# Patient Record
Sex: Female | Born: 1970 | Race: Black or African American | Hispanic: No | Marital: Married | Smoking: Never smoker
Health system: Southern US, Community
[De-identification: ages and names within clinical notes are randomized; demographics above are authoritative.]

## PROBLEM LIST (undated history)

## (undated) DIAGNOSIS — G709 Myoneural disorder, unspecified: Secondary | ICD-10-CM

## (undated) DIAGNOSIS — I10 Essential (primary) hypertension: Secondary | ICD-10-CM

## (undated) DIAGNOSIS — K219 Gastro-esophageal reflux disease without esophagitis: Secondary | ICD-10-CM

## (undated) DIAGNOSIS — E119 Type 2 diabetes mellitus without complications: Secondary | ICD-10-CM

## (undated) HISTORY — PX: OTHER SURGICAL HISTORY: SHX169

## (undated) HISTORY — DX: Type 2 diabetes mellitus without complications: E11.9

## (undated) HISTORY — PX: LUNG SURGERY: SHX703

---

## 2014-06-24 ENCOUNTER — Ambulatory Visit (INDEPENDENT_AMBULATORY_CARE_PROVIDER_SITE_OTHER): Payer: No Typology Code available for payment source

## 2014-06-24 VITALS — BP 139/81 | HR 99 | Resp 18

## 2014-06-24 DIAGNOSIS — S90129A Contusion of unspecified lesser toe(s) without damage to nail, initial encounter: Secondary | ICD-10-CM

## 2014-06-24 DIAGNOSIS — E1142 Type 2 diabetes mellitus with diabetic polyneuropathy: Secondary | ICD-10-CM

## 2014-06-24 DIAGNOSIS — E1140 Type 2 diabetes mellitus with diabetic neuropathy, unspecified: Secondary | ICD-10-CM

## 2014-06-24 DIAGNOSIS — L03039 Cellulitis of unspecified toe: Secondary | ICD-10-CM

## 2014-06-24 MED ORDER — CEPHALEXIN 500 MG PO CAPS: 500.0000 mg | ORAL_CAPSULE | Freq: Three times a day (TID) | ORAL | Status: AC

## 2014-06-24 NOTE — Progress Notes (Signed)
° °  Subjective:    Patient ID: Debbie Pacheco, female    DOB: [birth year], 43 y.o.   MRN: [id number]  HPI I AM A DIABETIC AND I WAS REFERRED BY DR April WILSON AND I HAVE NEUROPATHY AND A DOCTOR GAVE ME NEURONTIN AND IT DIDN'T SETTLE WITH MY STOMACH AND I WOULD LIKE TO TRY SOMETHING IF I COULD AND BURNS AND THROBS AND NUMBNESS AND TINGLING AND MY LEFT TOENAIL HAS A CRACK IN IT AND HAS HAD SOME PUSS AND THIS HAS BEEN GOING ON SINCE LAST THURSDAY  AND COLD ON MY FEET AT NIGHT     Review of Systems review of systems is unremarkable on all systems except for neuropathy burning and stinging and abnormal sensations to both feet and legs previously tried gabapentin could not tolerate GI upset     Objective:   Physical Exam 43 year old [place] female presents this time for initial visit for referral from primary physician has a history of contusion or injury to her left great toenail with the nail and crack or partially separated from the nailbed it's painful tender symptomatic patient also describes having prominence of paresthesias burning and stinging in her feet are straight objective findings as follows vascular status appears to be intact with pedal pulses palpable DP and PT +2/4 bilateral capillary refill time 3 seconds all digits epicritic and proprioceptive sensations diminished on Semmes Weinstein testing to the forefoot and digits patient Baird LyonsCasey last A1c was in the 12th range in her last blood glucose taken earlier today was 200. Although patient case she is actually improving from where she was. Patient describing burning and paresthesias in both feet legs at times on Semmes Weinstein testing absent sensation to the digits forefoot and heel area intact sensation over the dorsal foot leg and ankle there is normal plantar response DTRs not elicited dermatologically skin color pigment normal hair growth absent nails somewhat criptotic do left hallux nail shows an transverse fissuring and mid nail  with separation from the nailbed it's painful tender on palpation and ambulation. No other open wounds no other ulcerations no ascending psoas lymphangitis noted mild bloody drainage identified from the nailbed orthopedic biomechanical exam was rectus foot type mild semirigid digital contractures noted patient ambulating in flip-flops and otherwise offers and appropriate shoes also not been wearing socks.       Assessment & Plan:  Assessment this time his diabetes with contusion left great toe with fissuring or cracking of the nail and mild early paronychia. At this time the area is cleansed of Betadine prep was performed following local anesthetic block the left great toe the nail plate is partially avulsed the loose portions of nail fragment were debrided the remaining proximal half the nail is intact and the nailbed distally and slight laceration which is treated with Silvadene and gauze dressing being applied. Initiate daily cleansing with soap and water applying is for and Band-Aid dressing daily and a prescription for cephalexin was issued at this time. Patient will be candidate for neuropathy treatment however did not want to start another medication while taking antibiotic due to history of GI difficulties will recheck in 3 weeks for nail check check and evaluation at that time may initiate treatment with Lyrica for the peripheral neuropathy also dispensed literature on diabetic foot care proper shoe and sock wear. Scheduled to 3 week followup for nail check  Alvan Dameichard Sikora DPM

## 2014-06-24 NOTE — Patient Instructions (Signed)
ANTIBACTERIAL SOAP INSTRUCTIONS  THE DAY AFTER PROCEDURE  Please follow the instructions your doctor has marked.   Shower as usual. Before getting out, place a drop of antibacterial liquid soap (Dial) on a wet, clean washcloth.  Gently wipe washcloth over affected area.  Afterward, rinse the area with warm water.  Blot the area dry with a soft cloth and cover with antibiotic ointment (neosporin, polysporin, bacitracin) and band aid or gauze and tape  Place 3-4 drops of antibacterial liquid soap in a quart of warm tap water.  Submerge foot into water for 20 minutes.  If bandage was applied after your procedure, leave on to allow for easy lift off, then remove and continue with soak for the remaining time.  Next, blot area dry with a soft cloth and cover with a bandage.  Apply other medications as directed by your doctor, such as cortisporin otic solution (eardrops) or neosporin antibiotic ointment  After cleansing foot with soap and water daily rinse and dry thoroughly apply Neosporin and Band-Aid dressing daily maintain Neosporin and Band-Aid dressing his lungs is a discharge or drainage until the toe nailbed is healed    Diabetes and Foot Care Diabetes may cause you to have problems because of poor blood supply (circulation) to your feet and legs. This may cause the skin on your feet to become thinner, break easier, and heal more slowly. Your skin may become dry, and the skin may peel and crack. You may also have nerve damage in your legs and feet causing decreased feeling in them. You may not notice minor injuries to your feet that could lead to infections or more serious problems. Taking care of your feet is one of the most important things you can do for yourself.  HOME CARE INSTRUCTIONS  Wear shoes at all times, even in the house. Do not go barefoot. Bare feet are easily injured.  Check your feet daily for blisters, cuts, and redness. If you cannot see the bottom of your feet, use a mirror  or ask someone for help.  Wash your feet with warm water (do not use hot water) and mild soap. Then pat your feet and the areas between your toes until they are completely dry. Do not soak your feet as this can dry your skin.  Apply a moisturizing lotion or petroleum jelly (that does not contain alcohol and is unscented) to the skin on your feet and to dry, brittle toenails. Do not apply lotion between your toes.  Trim your toenails straight across. Do not dig under them or around the cuticle. File the edges of your nails with an emery board or nail file.  Do not cut corns or calluses or try to remove them with medicine.  Wear clean socks or stockings every day. Make sure they are not too tight. Do not wear knee-high stockings since they may decrease blood flow to your legs.  Wear shoes that fit properly and have enough cushioning. To break in new shoes, wear them for just a few hours a day. This prevents you from injuring your feet. Always look in your shoes before you put them on to be sure there are no objects inside.  Do not cross your legs. This may decrease the blood flow to your feet.  If you find a minor scrape, cut, or break in the skin on your feet, keep it and the skin around it clean and dry. These areas may be cleansed with mild soap and water. Do not  cleanse the area with peroxide, alcohol, or iodine.  When you remove an adhesive bandage, be sure not to damage the skin around it.  If you have a wound, look at it several times a day to make sure it is healing.  Do not use heating pads or hot water bottles. They may burn your skin. If you have lost feeling in your feet or legs, you may not know it is happening until it is too late.  Make sure your health care provider performs a complete foot exam at least annually or more often if you have foot problems. Report any cuts, sores, or bruises to your health care provider immediately. SEEK MEDICAL CARE IF:   You have an injury that  is not healing.  You have cuts or breaks in the skin.  You have an ingrown nail.  You notice redness on your legs or feet.  You feel burning or tingling in your legs or feet.  You have pain or cramps in your legs and feet.  Your legs or feet are numb.  Your feet always feel cold. SEEK IMMEDIATE MEDICAL CARE IF:   There is increasing redness, swelling, or pain in or around a wound.  There is a red line that goes up your leg.  Pus is coming from a wound.  You develop a fever or as directed by your health care provider.  You notice a bad smell coming from an ulcer or wound. Document Released: 11/23/2000 Document Revised: 07/29/2013 Document Reviewed: 05/05/2013 [facility] Patient Information 2015 [employer], [place]. This information is not intended to replace advice given to you by your health care provider. Make sure you discuss any questions you have with your health care provider.

## 2014-07-15 ENCOUNTER — Ambulatory Visit (INDEPENDENT_AMBULATORY_CARE_PROVIDER_SITE_OTHER): Payer: No Typology Code available for payment source

## 2014-07-15 VITALS — BP 134/89 | HR 90 | Resp 18

## 2014-07-15 DIAGNOSIS — S90129A Contusion of unspecified lesser toe(s) without damage to nail, initial encounter: Secondary | ICD-10-CM

## 2014-07-15 DIAGNOSIS — L03039 Cellulitis of unspecified toe: Secondary | ICD-10-CM

## 2014-07-15 NOTE — Patient Instructions (Signed)
Diabetes and Foot Care Diabetes may cause you to have problems because of poor blood supply (circulation) to your feet and legs. This may cause the skin on your feet to become thinner, break easier, and heal more slowly. Your skin may become dry, and the skin may peel and crack. You may also have nerve damage in your legs and feet causing decreased feeling in them. You may not notice minor injuries to your feet that could lead to infections or more serious problems. Taking care of your feet is one of the most important things you can do for yourself.  HOME CARE INSTRUCTIONS  Wear shoes at all times, even in the house. Do not go barefoot. Bare feet are easily injured.  Check your feet daily for blisters, cuts, and redness. If you cannot see the bottom of your feet, use a mirror or ask someone for help.  Wash your feet with warm water (do not use hot water) and mild soap. Then pat your feet and the areas between your toes until they are completely dry. Do not soak your feet as this can dry your skin.  Apply a moisturizing lotion or petroleum jelly (that does not contain alcohol and is unscented) to the skin on your feet and to dry, brittle toenails. Do not apply lotion between your toes.  Trim your toenails straight across. Do not dig under them or around the cuticle. File the edges of your nails with an emery board or nail file.  Do not cut corns or calluses or try to remove them with medicine.  Wear clean socks or stockings every day. Make sure they are not too tight. Do not wear knee-high stockings since they may decrease blood flow to your legs.  Wear shoes that fit properly and have enough cushioning. To break in new shoes, wear them for just a few hours a day. This prevents you from injuring your feet. Always look in your shoes before you put them on to be sure there are no objects inside.  Do not cross your legs. This may decrease the blood flow to your feet.  If you find a minor scrape,  cut, or break in the skin on your feet, keep it and the skin around it clean and dry. These areas may be cleansed with mild soap and water. Do not cleanse the area with peroxide, alcohol, or iodine.  When you remove an adhesive bandage, be sure not to damage the skin around it.  If you have a wound, look at it several times a day to make sure it is healing.  Do not use heating pads or hot water bottles. They may burn your skin. If you have lost feeling in your feet or legs, you may not know it is happening until it is too late.  Make sure your health care provider performs a complete foot exam at least annually or more often if you have foot problems. Report any cuts, sores, or bruises to your health care provider immediately. SEEK MEDICAL CARE IF:   You have an injury that is not healing.  You have cuts or breaks in the skin.  You have an ingrown nail.  You notice redness on your legs or feet.  You feel burning or tingling in your legs or feet.  You have pain or cramps in your legs and feet.  Your legs or feet are numb.  Your feet always feel cold. SEEK IMMEDIATE MEDICAL CARE IF:   There is increasing redness,   swelling, or pain in or around a wound.  There is a red line that goes up your leg.  Pus is coming from a wound.  You develop a fever or as directed by your health care provider.  You notice a bad smell coming from an ulcer or wound. Document Released: 11/23/2000 Document Revised: 07/29/2013 Document Reviewed: 05/05/2013 [employer] Patient Information 2015 [employer]. This information is not intended to replace advice given to you by your health care provider. Make sure you discuss any questions you have with your health care provider.  

## 2014-07-15 NOTE — Progress Notes (Signed)
   Subjective:    Patient ID: Graciela Husbandsasha Lindsey, female    DOB: December 17, 1970, 43 y.o.   MRN: [id number]  HPI IT IS DOING GOOD ON MY BIG TOENAIL ON MY LEFT FOOT AND DOES GET SORE SOME    Review of Systems no new findings or changes     Objective:   Physical Exam Neurovascular status intact pedal pulses are palpable patient is status post I&D an AP nail procedure lateral border of left great toe there is slight tenderness patient is doing well doing soaks applying Silvadene R. bandage and gauze dressing patient stop taking gabapentin for the GI upset and fascial edges of wait out the neural neuropathy symptoms may followup in the future and as-needed basis. Pedal pulses are palpable epicritic sensations diminished on Semmes Weinstein testing consistent with peripheral neuropathy no open wounds ulcerations no signs of infection no ascending psoas lymphangitis left hallux is resolved.       Assessment & Plan:  Assessment this time is good postop progress following AP nail procedure left great toe discharge to an as-needed basis Neosporin applied maintain some Neosporin feet are swollen or drainage or bleeding may discontinue treatment suggest a one-year diabetic foot check in annual followup as needed in the future.  Alvan Dameichard Clessie Karras DPM

## 2017-05-01 ENCOUNTER — Inpatient Hospital Stay (HOSPITAL_COMMUNITY)
Admission: AD | Admit: 2017-05-01 | Discharge: 2017-05-04 | DRG: 418 | Disposition: A | Discharge status: Home / Self Care | Payer: BLUE CROSS/BLUE SHIELD | Source: Other Acute Inpatient Hospital | Attending: Internal Medicine | Admitting: Internal Medicine

## 2017-05-01 ENCOUNTER — Encounter (HOSPITAL_COMMUNITY): Payer: Self-pay

## 2017-05-01 DIAGNOSIS — K8051 Calculus of bile duct without cholangitis or cholecystitis with obstruction: Principal | ICD-10-CM | POA: Diagnosis present

## 2017-05-01 DIAGNOSIS — Z881 Allergy status to other antibiotic agents status: Secondary | ICD-10-CM | POA: Diagnosis not present

## 2017-05-01 DIAGNOSIS — Z79899 Other long term (current) drug therapy: Secondary | ICD-10-CM

## 2017-05-01 DIAGNOSIS — E119 Type 2 diabetes mellitus without complications: Secondary | ICD-10-CM | POA: Diagnosis present

## 2017-05-01 DIAGNOSIS — E1169 Type 2 diabetes mellitus with other specified complication: Secondary | ICD-10-CM | POA: Diagnosis not present

## 2017-05-01 DIAGNOSIS — E669 Obesity, unspecified: Secondary | ICD-10-CM

## 2017-05-01 DIAGNOSIS — Z9109 Other allergy status, other than to drugs and biological substances: Secondary | ICD-10-CM | POA: Diagnosis not present

## 2017-05-01 DIAGNOSIS — Z794 Long term (current) use of insulin: Secondary | ICD-10-CM

## 2017-05-01 DIAGNOSIS — I10 Essential (primary) hypertension: Secondary | ICD-10-CM | POA: Diagnosis present

## 2017-05-01 DIAGNOSIS — Z7984 Long term (current) use of oral hypoglycemic drugs: Secondary | ICD-10-CM | POA: Diagnosis not present

## 2017-05-01 DIAGNOSIS — R1013 Epigastric pain: Secondary | ICD-10-CM

## 2017-05-01 DIAGNOSIS — R7989 Other specified abnormal findings of blood chemistry: Secondary | ICD-10-CM | POA: Diagnosis present

## 2017-05-01 DIAGNOSIS — R748 Abnormal levels of other serum enzymes: Secondary | ICD-10-CM

## 2017-05-01 DIAGNOSIS — Z886 Allergy status to analgesic agent status: Secondary | ICD-10-CM | POA: Diagnosis not present

## 2017-05-01 DIAGNOSIS — Z6841 Body Mass Index (BMI) 40.0 and over, adult: Secondary | ICD-10-CM

## 2017-05-01 DIAGNOSIS — K219 Gastro-esophageal reflux disease without esophagitis: Secondary | ICD-10-CM | POA: Diagnosis present

## 2017-05-01 DIAGNOSIS — K8050 Calculus of bile duct without cholangitis or cholecystitis without obstruction: Secondary | ICD-10-CM | POA: Diagnosis not present

## 2017-05-01 DIAGNOSIS — K8070 Calculus of gallbladder and bile duct without cholecystitis without obstruction: Secondary | ICD-10-CM | POA: Diagnosis present

## 2017-05-01 DIAGNOSIS — R109 Unspecified abdominal pain: Secondary | ICD-10-CM | POA: Diagnosis not present

## 2017-05-01 DIAGNOSIS — E66813 Obesity, class 3: Secondary | ICD-10-CM | POA: Diagnosis present

## 2017-05-01 HISTORY — DX: Myoneural disorder, unspecified: G70.9

## 2017-05-01 HISTORY — DX: Type 2 diabetes mellitus without complications: E11.9

## 2017-05-01 HISTORY — DX: Gastro-esophageal reflux disease without esophagitis: K21.9

## 2017-05-01 HISTORY — DX: Essential (primary) hypertension: I10

## 2017-05-01 NOTE — H&P (Signed)
History and Physical    Debbie Pacheco HQI:[id number] DOB: 1971/04/06 DOA: 05/01/2017  Referring MD/NP/PA: Dr. Adela Glimpse PCP: No primary care provider on file.  Patient coming from: [facility] transfer  Chief Complaint: Epigastric pain  HPI: Debbie Pacheco is a 46 y.o. female with medical history significant of HTN, DM type 2, and morbid obesity; who presents with complaints of worsening epigastric pain for at least the last 3 days. Patient describes pain as sharp and stabbing that intermittently comes and goes. Patient initially attributed symptoms to gas or indigestion and reports food may have worsened symptoms. She tried taking over-the-counter acid reflux medicine without relief. Symptoms for the last 3 days have became more constant and severe. Associated symptoms included nausea, vomiting, decreased appetite, change in urine color, and indigestion. Denies having any fever, chills, chest pain, shortness of breath, dysuria, diarrhea, or change in stool color/consistency. She reports last bowel movement was sometime yesterday. Patient's diabetes has been under better control and she reports a last known hemoglobin A1c between approximately 8 as it previously had been as high as 14.  While at [facility] emergency department lab work revealed WBC 4.5, hemoglobin 13.8, platelets 265, sodium 139, potassium 4, chloride 98, CO2 28, BUN 9, creatinine 0.8, glucose 231, total protein 8.1, total bilirubin 4.5, AST 522, ALT 570, alkaline phosphatase 213, and lipase within normal limits. Ultrasound showed cholelithiasis without evidence of acute cholecystitis dilatation of the common bile duct measuring 9 mm without definitive choledocholithiasis or distal obstructing mass. Transfer was initiated due to need of MRCP and/or ERCP.  ED Course: As seen above  Review of Systems: As per HPI otherwise 10 point review of systems negative.   Past Medical History:  Diagnosis Date  . Diabetes mellitus without  complication (HCC)   . GERD (gastroesophageal reflux disease)   . Neuromuscular disorder [facility])     Past Surgical History:  Procedure Laterality Date  . cataract surgery     . CESAREAN SECTION    . IUD PLACEMENT     . left lobectomy        reports that she has never smoked. She does not have any smokeless tobacco history on file. She reports that she does not drink alcohol or use drugs.  Allergies  Allergen Reactions  . Ciprofloxacin In D5w   . Ibuprofen   . Percocet [Oxycodone-Acetaminophen]     History reviewed. No pertinent family history.  Prior to Admission medications   Not on File    Physical Exam:   Constitutional:Morbidly obese female who appears to be in no acute distress at this time Vitals:   05/01/17 2317 05/01/17 2325  BP: (!) 172/98   Pulse: 99   Resp: 20   Temp: 98.1 F (36.7 C)   TempSrc: Oral   SpO2: 97%   Weight:  123.8 kg (273 lb)  Height:  5\' 4"  (1.626 m)   Eyes: PERRL, lids and conjunctivae normal ENMT: Mucous membranes are dry. Posterior pharynx clear of any exudate or lesions.   Neck: normal, supple, no masses, no thyromegaly Respiratory: clear to auscultation bilaterally, no wheezing, no crackles. Normal respiratory effort. No accessory muscle use.  Cardiovascular: Regular rate and rhythm, no murmurs / rubs / gallops. No extremity edema. 2+ pedal pulses. No carotid bruits.  Abdomen: Protuberant abdomen with epigastric tenderness to palpation, no masses palpated. No hepatosplenomegaly. Bowel sounds positive.  Musculoskeletal: no clubbing / cyanosis. No joint deformity upper and lower extremities. Good ROM, no contractures. Normal muscle tone.  Skin:  no rashes, lesions, ulcers. No induration Neurologic: CN 2-12 grossly intact. Sensation intact, DTR normal. Strength 5/5 in all 4.  Psychiatric: Normal judgment and insight. Alert and oriented x 3. Normal mood.     Labs on Admission: I have personally reviewed following labs and imaging  studies  CBC: No results for input(s): WBC, NEUTROABS, HGB, HCT, MCV, PLT in the last 168 hours. Basic Metabolic Panel: No results for input(s): NA, K, CL, CO2, GLUCOSE, BUN, CREATININE, CALCIUM, MG, PHOS in the last 168 hours. GFR: CrCl cannot be calculated (No order found.). Liver Function Tests: No results for input(s): AST, ALT, ALKPHOS, BILITOT, PROT, ALBUMIN in the last 168 hours. No results for input(s): LIPASE, AMYLASE in the last 168 hours. No results for input(s): AMMONIA in the last 168 hours. Coagulation Profile: No results for input(s): INR, PROTIME in the last 168 hours. Cardiac Enzymes: No results for input(s): CKTOTAL, CKMB, CKMBINDEX, TROPONINI in the last 168 hours. BNP (last 3 results) No results for input(s): PROBNP in the last 8760 hours. HbA1C: No results for input(s): HGBA1C in the last 72 hours. CBG: No results for input(s): GLUCAP in the last 168 hours. Lipid Profile: No results for input(s): CHOL, HDL, LDLCALC, TRIG, CHOLHDL, LDLDIRECT in the last 72 hours. Thyroid Function Tests: No results for input(s): TSH, T4TOTAL, FREET4, T3FREE, THYROIDAB in the last 72 hours. Anemia Panel: No results for input(s): VITAMINB12, FOLATE, FERRITIN, TIBC, IRON, RETICCTPCT in the last 72 hours. Urine analysis: No results found for: COLORURINE, APPEARANCEUR, LABSPEC, PHURINE, GLUCOSEU, HGBUR, BILIRUBINUR, KETONESUR, PROTEINUR, UROBILINOGEN, NITRITE, LEUKOCYTESUR Sepsis Labs: No results found for this or any previous visit (from the past 240 hour(s)).   Radiological Exams on Admission: No results found.    Assessment/Plan Suspected choledocholithiasis: Acute. Patient presents with epigastric pain found to have significantly elevated liver enzymes. Ultrasound revealed cholelithiasis with dilatation of the common bile duct up to 9 mm without a definitive stone observed. - Admit to med-surg bed - IV fluids normal saline at 12700ml/hr - Pain control with IV morphine as  needed - Gastroenterology consulted Dr. Myrtie Neitheranis, and Dr. Adela LankArmbruster will see in a.m.  - May warrant surgical consultation as well during this hospitalization for need of cholecystectomy  Elevated LFTs: Acute. Outside facility lab work revealed bilirubin 4.5, AST 522, ALT 570, and alkaline phosphatase 213. Suspect secondary to above. - Continue to monitor   Diabetes mellitus type 2: Patient reports last known hemoglobin A1c was approximately 8 which is improved from previously at 14. Patient's blood sugars were noted to be as high as 231 at the outside facility.  - Hypoglycemic protocols  - Hold metformin and glipizide  - Give NPH 10 units 1 dose. Normal dosage is 40 units subcutaneous twice a day.  - CBGs q 6 hrs with sensitive SSI -  Restart home insulin regimen once able  Benign essential hypertension:  Patient currently not reporting being on any blood pressure medications. - Continue to monitor  Morbid obesity: BMI 47  GI prophylaxis: protonix DVT prophylaxis: Lovenox Code Status: Full  Family Communication:  Discussed plan of care with the patient family present at bedside. Disposition Plan: likely  discharge home in 1-2 days  Consults called: None Admission status: Inpatient   Clydie Braunondell A Aliegha Paullin MD [facility] Pager [phone]  If 7PM-7AM, please contact night-coverage [email or web address] Password [email or web address]  05/01/2017, 11:52 PM

## 2017-05-02 ENCOUNTER — Inpatient Hospital Stay (HOSPITAL_COMMUNITY): Payer: BLUE CROSS/BLUE SHIELD | Admitting: Certified Registered Nurse Anesthetist

## 2017-05-02 ENCOUNTER — Encounter (HOSPITAL_COMMUNITY)
Admission: AD | Disposition: A | Discharge status: Home / Self Care | Payer: Self-pay | Source: Other Acute Inpatient Hospital | Attending: Internal Medicine

## 2017-05-02 ENCOUNTER — Encounter (HOSPITAL_COMMUNITY): Payer: Self-pay

## 2017-05-02 ENCOUNTER — Inpatient Hospital Stay (HOSPITAL_COMMUNITY): Payer: BLUE CROSS/BLUE SHIELD

## 2017-05-02 DIAGNOSIS — E1169 Type 2 diabetes mellitus with other specified complication: Secondary | ICD-10-CM | POA: Diagnosis present

## 2017-05-02 DIAGNOSIS — R1013 Epigastric pain: Secondary | ICD-10-CM

## 2017-05-02 DIAGNOSIS — I10 Essential (primary) hypertension: Secondary | ICD-10-CM | POA: Diagnosis present

## 2017-05-02 DIAGNOSIS — R109 Unspecified abdominal pain: Secondary | ICD-10-CM

## 2017-05-02 DIAGNOSIS — K8050 Calculus of bile duct without cholangitis or cholecystitis without obstruction: Secondary | ICD-10-CM

## 2017-05-02 DIAGNOSIS — E66813 Obesity, class 3: Secondary | ICD-10-CM | POA: Diagnosis present

## 2017-05-02 DIAGNOSIS — R748 Abnormal levels of other serum enzymes: Secondary | ICD-10-CM | POA: Diagnosis present

## 2017-05-02 DIAGNOSIS — E669 Obesity, unspecified: Secondary | ICD-10-CM

## 2017-05-02 HISTORY — PX: ERCP: SHX5425

## 2017-05-02 LAB — COMPREHENSIVE METABOLIC PANEL
ALT: 433 U/L — ABNORMAL HIGH (ref 14–54)
AST: 324 U/L — ABNORMAL HIGH (ref 15–41)
Albumin: 3.7 g/dL (ref 3.5–5.0)
Alkaline Phosphatase: 196 U/L — ABNORMAL HIGH (ref 38–126)
Anion gap: 8 (ref 5–15)
BUN: 9 mg/dL (ref 6–20)
CO2: 26 mmol/L (ref 22–32)
Calcium: 8.9 mg/dL (ref 8.9–10.3)
Chloride: 100 mmol/L — ABNORMAL LOW (ref 101–111)
Creatinine, Ser: 0.7 mg/dL (ref 0.44–1.00)
GFR calc Af Amer: >60 mL/min (ref >60)
GFR calc non Af Amer: >60 mL/min (ref >60)
Glucose, Bld: 196 mg/dL — ABNORMAL HIGH (ref 65–99)
Potassium: 3.6 mmol/L (ref 3.5–5.1)
Sodium: 134 mmol/L — ABNORMAL LOW (ref 135–145)
Total Bilirubin: 6.2 mg/dL — ABNORMAL HIGH (ref 0.3–1.2)
Total Protein: 7.8 g/dL (ref 6.5–8.1)

## 2017-05-02 LAB — CBC WITH DIFFERENTIAL/PLATELET
Basophils Absolute: 0 10*3/uL (ref 0.0–0.1)
Basophils Relative: 0 %
Eosinophils Absolute: 0.1 10*3/uL (ref 0.0–0.7)
Eosinophils Relative: 2 %
HCT: 36.5 % (ref 36.0–46.0)
Hemoglobin: 12.7 g/dL (ref 12.0–15.0)
Lymphocytes Relative: 29 %
Lymphs Abs: 1.3 10*3/uL (ref 0.7–4.0)
MCH: 27.6 pg (ref 26.0–34.0)
MCHC: 34.8 g/dL (ref 30.0–36.0)
MCV: 79.3 fL (ref 78.0–100.0)
Monocytes Absolute: 0.3 10*3/uL (ref 0.1–1.0)
Monocytes Relative: 8 %
Neutro Abs: 2.7 10*3/uL (ref 1.7–7.7)
Neutrophils Relative %: 61 %
Platelets: 234 10*3/uL (ref 150–400)
RBC: 4.6 MIL/uL (ref 3.87–5.11)
RDW: 14.1 % (ref 11.5–15.5)
WBC: 4.4 10*3/uL (ref 4.0–10.5)

## 2017-05-02 LAB — GLUCOSE, CAPILLARY
Glucose-Capillary: 173 mg/dL — ABNORMAL HIGH (ref 65–99)
Glucose-Capillary: 183 mg/dL — ABNORMAL HIGH (ref 65–99)
Glucose-Capillary: 149 mg/dL — ABNORMAL HIGH (ref 65–99)
Glucose-Capillary: 140 mg/dL — ABNORMAL HIGH (ref 65–99)
Glucose-Capillary: 225 mg/dL — ABNORMAL HIGH (ref 65–99)

## 2017-05-02 LAB — HIV ANTIBODY (ROUTINE TESTING W REFLEX): HIV Screen 4th Generation wRfx: NONREACTIVE

## 2017-05-02 LAB — SURGICAL PCR SCREEN
MRSA, PCR: NEGATIVE
Staphylococcus aureus: NEGATIVE

## 2017-05-02 LAB — LIPASE, BLOOD: Lipase: 32 U/L (ref 11–51)

## 2017-05-02 SURGERY — ERCP, WITH INTERVENTION IF INDICATED
Anesthesia: General

## 2017-05-02 MED ORDER — CEFTRIAXONE SODIUM 2 G IJ SOLR: 2.0000 g | INTRAMUSCULAR | Status: DC

## 2017-05-02 MED ORDER — FENTANYL CITRATE (PF) 100 MCG/2ML IJ SOLN
25.0000 ug | INTRAMUSCULAR | Status: DC | PRN
  Administered 2017-05-02: 25 ug via INTRAVENOUS
  Filled 2017-05-02: qty 2

## 2017-05-02 MED ORDER — LACTATED RINGERS IV SOLN
INTRAVENOUS | Status: DC
  Administered 2017-05-02 – 2017-05-03 (×3): via INTRAVENOUS

## 2017-05-02 MED ORDER — INSULIN ASPART 100 UNIT/ML ~~LOC~~ SOLN: 0.0000 [IU] | Freq: Three times a day (TID) | SUBCUTANEOUS | Status: DC

## 2017-05-02 MED ORDER — CHLORHEXIDINE GLUCONATE CLOTH 2 % EX PADS: 6.0000 | MEDICATED_PAD | Freq: Once | CUTANEOUS | Status: DC

## 2017-05-02 MED ORDER — SUGAMMADEX SODIUM 500 MG/5ML IV SOLN
INTRAVENOUS | Status: AC
  Filled 2017-05-02: qty 5

## 2017-05-02 MED ORDER — HYDRALAZINE HCL 20 MG/ML IJ SOLN: 10.0000 mg | INTRAMUSCULAR | Status: DC | PRN

## 2017-05-02 MED ORDER — ALBUTEROL SULFATE (2.5 MG/3ML) 0.083% IN NEBU: 2.5000 mg | INHALATION_SOLUTION | RESPIRATORY_TRACT | Status: DC | PRN

## 2017-05-02 MED ORDER — INSULIN GLARGINE 100 UNIT/ML ~~LOC~~ SOLN
20.0000 [IU] | Freq: Every day | SUBCUTANEOUS | Status: DC
  Filled 2017-05-02: qty 0.2

## 2017-05-02 MED ORDER — IOPAMIDOL (ISOVUE-300) INJECTION 61%
INTRAVENOUS | Status: DC | PRN
  Administered 2017-05-02: 30 mL

## 2017-05-02 MED ORDER — INSULIN ASPART 100 UNIT/ML ~~LOC~~ SOLN
0.0000 [IU] | Freq: Four times a day (QID) | SUBCUTANEOUS | Status: DC
  Administered 2017-05-02: 1 [IU] via SUBCUTANEOUS
  Administered 2017-05-02: 3 [IU] via SUBCUTANEOUS
  Administered 2017-05-02: 1 [IU] via SUBCUTANEOUS
  Administered 2017-05-03: 2 [IU] via SUBCUTANEOUS
  Administered 2017-05-03: 7 [IU] via SUBCUTANEOUS
  Administered 2017-05-04: 9 [IU] via SUBCUTANEOUS
  Administered 2017-05-04: 5 [IU] via SUBCUTANEOUS

## 2017-05-02 MED ORDER — INSULIN ASPART 100 UNIT/ML ~~LOC~~ SOLN
0.0000 [IU] | Freq: Every day | SUBCUTANEOUS | Status: DC
  Administered 2017-05-02: 2 [IU] via SUBCUTANEOUS

## 2017-05-02 MED ORDER — PROPOFOL 10 MG/ML IV BOLUS
INTRAVENOUS | Status: DC | PRN
  Administered 2017-05-02: 200 mg via INTRAVENOUS

## 2017-05-02 MED ORDER — MIDAZOLAM HCL 2 MG/2ML IJ SOLN
INTRAMUSCULAR | Status: AC
  Filled 2017-05-02: qty 2

## 2017-05-02 MED ORDER — LIDOCAINE 2% (20 MG/ML) 5 ML SYRINGE
INTRAMUSCULAR | Status: DC | PRN
  Administered 2017-05-02: 100 mg via INTRAVENOUS

## 2017-05-02 MED ORDER — INDOMETHACIN 50 MG RE SUPP: 50.0000 mg | Freq: Once | RECTAL | Status: DC

## 2017-05-02 MED ORDER — AMPICILLIN-SULBACTAM SODIUM 1.5 (1-0.5) G IJ SOLR
1.5000 g | Freq: Once | INTRAMUSCULAR | Status: AC
  Administered 2017-05-02: 1.5 g via INTRAVENOUS
  Filled 2017-05-02: qty 1.5

## 2017-05-02 MED ORDER — HYDROMORPHONE HCL 1 MG/ML IJ SOLN: 0.2500 mg | INTRAMUSCULAR | Status: DC | PRN

## 2017-05-02 MED ORDER — ROCURONIUM BROMIDE 10 MG/ML (PF) SYRINGE
PREFILLED_SYRINGE | INTRAVENOUS | Status: DC | PRN
  Administered 2017-05-02: 50 mg via INTRAVENOUS

## 2017-05-02 MED ORDER — SUGAMMADEX SODIUM 500 MG/5ML IV SOLN
INTRAVENOUS | Status: DC | PRN
  Administered 2017-05-02: 250 mg via INTRAVENOUS

## 2017-05-02 MED ORDER — PROPOFOL 10 MG/ML IV BOLUS
INTRAVENOUS | Status: AC
  Filled 2017-05-02: qty 20

## 2017-05-02 MED ORDER — INDOMETHACIN 50 MG RE SUPP
RECTAL | Status: DC | PRN
  Administered 2017-05-02: 100 mg via RECTAL

## 2017-05-02 MED ORDER — PANTOPRAZOLE SODIUM 40 MG IV SOLR
40.0000 mg | Freq: Every day | INTRAVENOUS | Status: DC
  Administered 2017-05-02 – 2017-05-03 (×3): 40 mg via INTRAVENOUS
  Filled 2017-05-02 (×3): qty 40

## 2017-05-02 MED ORDER — GLUCAGON HCL RDNA (DIAGNOSTIC) 1 MG IJ SOLR
INTRAMUSCULAR | Status: DC | PRN
  Administered 2017-05-02 (×2): 0.25 mg via INTRAVENOUS

## 2017-05-02 MED ORDER — GLUCAGON HCL RDNA (DIAGNOSTIC) 1 MG IJ SOLR
INTRAMUSCULAR | Status: AC
  Filled 2017-05-02: qty 1

## 2017-05-02 MED ORDER — LIDOCAINE 2% (20 MG/ML) 5 ML SYRINGE
INTRAMUSCULAR | Status: AC
  Filled 2017-05-02: qty 5

## 2017-05-02 MED ORDER — ONDANSETRON HCL 4 MG/2ML IJ SOLN: 4.0000 mg | Freq: Four times a day (QID) | INTRAMUSCULAR | Status: DC | PRN

## 2017-05-02 MED ORDER — FENTANYL CITRATE (PF) 100 MCG/2ML IJ SOLN
INTRAMUSCULAR | Status: DC | PRN
  Administered 2017-05-02 (×2): 50 ug via INTRAVENOUS

## 2017-05-02 MED ORDER — INSULIN NPH (HUMAN) (ISOPHANE) 100 UNIT/ML ~~LOC~~ SUSP
10.0000 [IU] | Freq: Once | SUBCUTANEOUS | Status: AC
  Administered 2017-05-02: 10 [IU] via SUBCUTANEOUS
  Filled 2017-05-02: qty 10

## 2017-05-02 MED ORDER — INDOMETHACIN 50 MG RE SUPP
RECTAL | Status: AC
  Filled 2017-05-02: qty 2

## 2017-05-02 MED ORDER — MIDAZOLAM HCL 5 MG/5ML IJ SOLN
INTRAMUSCULAR | Status: DC | PRN
  Administered 2017-05-02: 2 mg via INTRAVENOUS

## 2017-05-02 MED ORDER — CHLORHEXIDINE GLUCONATE CLOTH 2 % EX PADS
6.0000 | MEDICATED_PAD | Freq: Once | CUTANEOUS | Status: DC
Start: 2017-05-02 — End: 2017-05-02

## 2017-05-02 MED ORDER — ONDANSETRON HCL 4 MG PO TABS: 4.0000 mg | ORAL_TABLET | Freq: Four times a day (QID) | ORAL | Status: DC | PRN

## 2017-05-02 MED ORDER — FENTANYL CITRATE (PF) 100 MCG/2ML IJ SOLN
INTRAMUSCULAR | Status: AC
  Filled 2017-05-02: qty 2

## 2017-05-02 MED ORDER — SODIUM CHLORIDE 0.9 % IV SOLN
INTRAVENOUS | Status: DC
  Administered 2017-05-02 – 2017-05-04 (×4): via INTRAVENOUS

## 2017-05-02 MED ORDER — PROMETHAZINE HCL 25 MG/ML IJ SOLN: 6.2500 mg | INTRAMUSCULAR | Status: DC | PRN

## 2017-05-02 MED ORDER — DEXTROSE 5 % IV SOLN: 2.0000 g | INTRAVENOUS | Status: DC

## 2017-05-02 MED ORDER — ENOXAPARIN SODIUM 60 MG/0.6ML ~~LOC~~ SOLN
60.0000 mg | Freq: Every day | SUBCUTANEOUS | Status: DC
  Administered 2017-05-02: 60 mg via SUBCUTANEOUS
  Filled 2017-05-02: qty 0.6

## 2017-05-02 NOTE — Progress Notes (Signed)
Patient ID: Debbie Pacheco, female   DOB: 1971/09/22, 46 y.o.   MRN: [id number]    PROGRESS NOTE    Debbie Pacheco  MVH:[id number] DOB: 1971-08-09 DOA: 05/01/2017  PCP: System, Pcp Not In   Brief Narrative:  Patient is 46 year old female, presented to [facility] with main concern of 3 days duration of progressively worsening epigastric pain, mostly intermittent and postprandial, associated with nausea and occasional nonbloody vomiting. Please note that patient was transferred from [facility] hospital after patient's blood work showed elevated bilirubin 4.5, AST 522, ALT 570, alkaline phosphatase 213, ultrasound notable for cholelithiasis and CBD 9 mm in diameter. Plan was to transfer patient due to need for MRCP or ERCP.  Assessment & Plan:   Principal Problem:   Choledocholithiasis, symptomatic cholelithiasis, elevated liver enzymes - Plan for ERCP today, further recommendations pending ERCP results - Appreciate GI team and surgery team assistance - Allow analgesia and antiemetics as needed - Tentative plan for laparoscopic cholecystectomy tomorrow - Repeat lipase and CMP in AM  Active Problems:   Diabetes mellitus type 2 in obese (HCC) - Keep on sliding scale insulin for now    Benign essential HTN - Reasonable inpatient control    Obesity, Class III, BMI 40-49.9 (morbid obesity) (HCC) - Body mass index is 46.86 kg/m.   DVT prophylaxis: SCD's Code Status: Full Family Communication: Patient and husband at bedside  Disposition Plan: Once medically stable patient will go home  Consultants:   Surgery  GI  Procedures:   ERCP planned for today 05/02/2017  Antimicrobials:   Rocephin 5/23 -->  Subjective: Patient reports feeling better this morning  Objective: Vitals:   05/01/17 2317 05/01/17 2325 05/02/17 0415  BP: (!) 172/98  136/88  Pulse: 99  97  Resp: 20  18  Temp: 98.1 F (36.7 C)  98.2 F (36.8 C)  TempSrc: Oral  Oral  SpO2: 97%  98%  Weight:  123.8 kg  (273 lb)   Height:  5\' 4"  (1.626 m)     Intake/Output Summary (Last 24 hours) at 05/02/17 1235 Last data filed at 05/02/17 0609  Gross per 24 hour  Intake              600 ml  Output                0 ml  Net              600 ml   Filed Weights   05/01/17 2325  Weight: 123.8 kg (273 lb)    Examination:  General exam: Appears calm and comfortable  Respiratory system: Clear to auscultation. Respiratory effort normal. Cardiovascular system: S1 & S2 heard, RRR. No JVD, murmurs, rubs, gallops or clicks. No pedal edema. Gastrointestinal system: Abdomen is nondistended, soft, Slightly tender in right upper abdominal quadrant area  Central nervous system: Alert and oriented. No focal neurological deficits. Extremities: Symmetric 5 x 5 power. Skin: No rashes, lesions or ulcers Psychiatry: Judgement and insight appear normal. Mood & affect appropriate.    Data Reviewed: I have personally reviewed following labs and imaging studies  CBC:  Recent Labs Lab 05/02/17 0106  WBC 4.4  NEUTROABS 2.7  HGB 12.7  HCT 36.5  MCV 79.3  PLT 234   Basic Metabolic Panel:  Recent Labs Lab 05/02/17 0106  NA 134*  K 3.6  CL 100*  CO2 26  GLUCOSE 196*  BUN 9  CREATININE 0.70  CALCIUM 8.9   Liver Function Tests:  Recent Labs Lab  05/02/17 0106  AST 324*  ALT 433*  ALKPHOS 196*  BILITOT 6.2*  PROT 7.8  ALBUMIN 3.7    Recent Labs Lab 05/02/17 0106  LIPASE 32   CBG:  Recent Labs Lab 05/02/17 0408 05/02/17 0729 05/02/17 1135  GLUCAP 173* 183* 149*   Radiology Studies: No results found.  Scheduled Meds: . Chlorhexidine Gluconate Cloth  6 each Topical Once   And  . [START ON 05/03/2017] Chlorhexidine Gluconate Cloth  6 each Topical Once  . indomethacin  50 mg Rectal Once  . insulin aspart  0-9 Units Subcutaneous Q6H  . pantoprazole (PROTONIX) IV  40 mg Intravenous QHS   Continuous Infusions: . sodium chloride 100 mL/hr at 05/02/17 1020  . [START ON 05/03/2017]  cefTRIAXone (ROCEPHIN)  IV       LOS: 1 day   Time spent: 20 minutes   [name], MD [facility] Pager [phone]  If 7PM-7AM, please contact night-coverage [email or web address] Password [email or web address] 05/02/2017, 12:35 PM

## 2017-05-02 NOTE — H&P (View-Only) (Signed)
HPI :  46 y/o female with a history of morbid obesity, GERD, DM, presenting with abdominal pain.  She endorses epigastric to RUQ pain in the postprandial setting for the past month, would occur periodically, but becoming more frequent over time. Over the past few days she has had severe worsening of symptoms, severe postprandial epigastric to RUQ pain which radiates through to her back. No nausea or vomiting. No fevers. She thought symptoms were due to heartburn / reflux. Last A1c reported around 8.   She presented to [facility], WBC was 4.5, Hgb 13.8. LFTs on arrival show bilirubin of 6.2 (previously 4.5), ALT 433 (previously 570), AST 324 (previously 522). Lipase normal. [place] at outside hospital reportedly showed CBD of 9mm and gallstones without evidence of cholecystitis or definitive choledocholithiasis.   At present time she reports she does not have pain. States her pain resolved last night. She is fearful of eating as this has reliably caused severe recurrence of symptoms. Of note, she received 60mg  of lovenox at 1 AM last night    Past Medical History:  Diagnosis Date  . Diabetes mellitus without complication (HCC)   . GERD (gastroesophageal reflux disease)   . Neuromuscular disorder (HCC)   Morbid obesity.   Past Surgical History:  Procedure Laterality Date  . cataract surgery     . CESAREAN SECTION    . IUD PLACEMENT     . left lobectomy      Family History  Problem Relation Age of Onset  . Family history unknown: Yes   Social History  Substance Use Topics  . Smoking status: Never Smoker  . Smokeless tobacco: Never Used  . Alcohol use No   Current Facility-Administered Medications  Medication Dose Route Frequency Provider Last Rate Last Dose  . 0.9 %  sodium chloride infusion   Intravenous Continuous Madelyn Flavors A, MD 100 mL/hr at 05/02/17 0114    . albuterol (PROVENTIL) (2.5 MG/3ML) 0.083% nebulizer solution 2.5 mg  2.5 mg Nebulization Q2H PRN Smith,  Rondell A, MD      . enoxaparin (LOVENOX) injection 60 mg  60 mg Subcutaneous QHS Smith, Rondell A, MD   60 mg at 05/02/17 0100  . fentaNYL (SUBLIMAZE) injection 25-50 mcg  25-50 mcg Intravenous Q3H PRN Katrinka Blazing, Rondell A, MD      . hydrALAZINE (APRESOLINE) injection 10 mg  10 mg Intravenous Q4H PRN Smith, Rondell A, MD      . insulin aspart (novoLOG) injection 0-9 Units  0-9 Units Subcutaneous Q6H Smith, Rondell A, MD      . ondansetron (ZOFRAN) tablet 4 mg  4 mg Oral Q6H PRN Madelyn Flavors A, MD       Or  . ondansetron (ZOFRAN) injection 4 mg  4 mg Intravenous Q6H PRN Smith, Rondell A, MD      . pantoprazole (PROTONIX) injection 40 mg  40 mg Intravenous QHS Smith, Rondell A, MD   40 mg at 05/02/17 0124   Allergies  Allergen Reactions  . Ciprofloxacin In D5w Nausea And Vomiting  . Ibuprofen Other (See Comments)    Stomach cramps  . Percocet [Oxycodone-Acetaminophen] Other (See Comments)    Stomach cramps     Review of Systems: All systems reviewed and negative except where noted in HPI.   Lab Results  Component Value Date   ALT 433 (H) 05/02/2017   AST 324 (H) 05/02/2017   ALKPHOS 196 (H) 05/02/2017   BILITOT 6.2 (H) 05/02/2017    Lab Results  Component Value Date   WBC 4.4 05/02/2017   HGB 12.7 05/02/2017   HCT 36.5 05/02/2017   MCV 79.3 05/02/2017   PLT 234 05/02/2017     Lab Results  Component Value Date   CREATININE 0.70 05/02/2017   BUN 9 05/02/2017   NA 134 (L) 05/02/2017   K 3.6 05/02/2017   CL 100 (L) 05/02/2017   CO2 26 05/02/2017     Physical Exam: BP 136/88 (BP Location: Right Arm)   Pulse 97   Temp 98.2 F (36.8 C) (Oral)   Resp 18   Ht 5\' 4"  (1.626 m)   Wt 273 lb (123.8 kg)   LMP 03/28/2017   SpO2 98%   BMI 46.86 kg/m  Constitutional: Pleasant, female in no acute distress. HEENT: Normocephalic and atraumatic. Conjunctivae are normal. (+) scleral icterus. Neck supple.  Cardiovascular: Normal rate, regular rhythm.  Pulmonary/chest: Effort  normal and breath sounds normal. No wheezing, rales or rhonchi. Abdominal: Soft, obese abdomen, mild tenderness in epigastric area.  There are no masses palpable. No hepatomegaly. Extremities: no edema Lymphadenopathy: No cervical adenopathy noted. Neurological: Alert and oriented to person place and time. Skin: Skin is warm and dry. No rashes noted. Psychiatric: Normal mood and affect. Behavior is normal.   ASSESSMENT AND PLAN: 46 y/o female with h/o morbid obesity and DM, presenting with abdominal pain, elevated liver enzymes / hyperbilirubinemia, with cholelithiasis and a dilated CBD on US. She very likely has or had choledocholithiasis given bili of 6 and mildly dilated CBD on US. It's possible she passed a stone given her pain has resolved with NPO status, however she has very strong predictors (bili > 4 and dilated CBD) for suspected choledocholithiasis and meets criteria to proceed directly for ERCP at this time.   I discussed the risks / benefits of ERCP with her, to include risk of pancreatitis, bleeding, and perforation. She agreed with the plan and wanted to proceed. I discussed that once this has been done, she warrants cholecystectomy to prevent recurrence. Please consult general surgery for cholecystectomy to be done during this admission.   Otherwise WBC normal, lipase normal, no need for antibiotics. Please hold further lovenox until ERCP is performed. Tentatively scheduled for 1PM today. NPO until that time.  Please call with questions / concerns.   Ileene PatrickSteven Rosser Collington, MD [facility] Pager [phone]

## 2017-05-02 NOTE — Consult Note (Signed)
HPI :  46 y/o female with a history of morbid obesity, GERD, DM, presenting with abdominal pain.  She endorses epigastric to RUQ pain in the postprandial setting for the past month, would occur periodically, but becoming more frequent over time. Over the past few days she has had severe worsening of symptoms, severe postprandial epigastric to RUQ pain which radiates through to her back. No nausea or vomiting. No fevers. She thought symptoms were due to heartburn / reflux. Last A1c reported around 8.   She presented to [facility], WBC was 4.5, Hgb 13.8. LFTs on arrival show bilirubin of 6.2 (previously 4.5), ALT 433 (previously 570), AST 324 (previously 522). Lipase normal. [place] at outside hospital reportedly showed CBD of 9mm and gallstones without evidence of cholecystitis or definitive choledocholithiasis.   At present time she reports she does not have pain. States her pain resolved last night. She is fearful of eating as this has reliably caused severe recurrence of symptoms. Of note, she received 60mg  of lovenox at 1 AM last night    Past Medical History:  Diagnosis Date  . Diabetes mellitus without complication (HCC)   . GERD (gastroesophageal reflux disease)   . Neuromuscular disorder (HCC)   Morbid obesity.   Past Surgical History:  Procedure Laterality Date  . cataract surgery     . CESAREAN SECTION    . IUD PLACEMENT     . left lobectomy      Family History  Problem Relation Age of Onset  . Family history unknown: Yes   Social History  Substance Use Topics  . Smoking status: Never Smoker  . Smokeless tobacco: Never Used  . Alcohol use No   Current Facility-Administered Medications  Medication Dose Route Frequency Provider Last Rate Last Dose  . 0.9 %  sodium chloride infusion   Intravenous Continuous Madelyn Flavors A, MD 100 mL/hr at 05/02/17 0114    . albuterol (PROVENTIL) (2.5 MG/3ML) 0.083% nebulizer solution 2.5 mg  2.5 mg Nebulization Q2H PRN Smith,  Rondell A, MD      . enoxaparin (LOVENOX) injection 60 mg  60 mg Subcutaneous QHS Smith, Rondell A, MD   60 mg at 05/02/17 0100  . fentaNYL (SUBLIMAZE) injection 25-50 mcg  25-50 mcg Intravenous Q3H PRN Katrinka Blazing, Rondell A, MD      . hydrALAZINE (APRESOLINE) injection 10 mg  10 mg Intravenous Q4H PRN Smith, Rondell A, MD      . insulin aspart (novoLOG) injection 0-9 Units  0-9 Units Subcutaneous Q6H Smith, Rondell A, MD      . ondansetron (ZOFRAN) tablet 4 mg  4 mg Oral Q6H PRN Madelyn Flavors A, MD       Or  . ondansetron (ZOFRAN) injection 4 mg  4 mg Intravenous Q6H PRN Smith, Rondell A, MD      . pantoprazole (PROTONIX) injection 40 mg  40 mg Intravenous QHS Smith, Rondell A, MD   40 mg at 05/02/17 0124   Allergies  Allergen Reactions  . Ciprofloxacin In D5w Nausea And Vomiting  . Ibuprofen Other (See Comments)    Stomach cramps  . Percocet [Oxycodone-Acetaminophen] Other (See Comments)    Stomach cramps     Review of Systems: All systems reviewed and negative except where noted in HPI.   Lab Results  Component Value Date   ALT 433 (H) 05/02/2017   AST 324 (H) 05/02/2017   ALKPHOS 196 (H) 05/02/2017   BILITOT 6.2 (H) 05/02/2017    Lab Results  Component Value Date   WBC 4.4 05/02/2017   HGB 12.7 05/02/2017   HCT 36.5 05/02/2017   MCV 79.3 05/02/2017   PLT 234 05/02/2017     Lab Results  Component Value Date   CREATININE 0.70 05/02/2017   BUN 9 05/02/2017   NA 134 (L) 05/02/2017   K 3.6 05/02/2017   CL 100 (L) 05/02/2017   CO2 26 05/02/2017     Physical Exam: BP 136/88 (BP Location: Right Arm)   Pulse 97   Temp 98.2 F (36.8 C) (Oral)   Resp 18   Ht 5\' 4"  (1.626 m)   Wt 273 lb (123.8 kg)   LMP 03/28/2017   SpO2 98%   BMI 46.86 kg/m  Constitutional: Pleasant, female in no acute distress. HEENT: Normocephalic and atraumatic. Conjunctivae are normal. (+) scleral icterus. Neck supple.  Cardiovascular: Normal rate, regular rhythm.  Pulmonary/chest: Effort  normal and breath sounds normal. No wheezing, rales or rhonchi. Abdominal: Soft, obese abdomen, mild tenderness in epigastric area.  There are no masses palpable. No hepatomegaly. Extremities: no edema Lymphadenopathy: No cervical adenopathy noted. Neurological: Alert and oriented to person place and time. Skin: Skin is warm and dry. No rashes noted. Psychiatric: Normal mood and affect. Behavior is normal.   ASSESSMENT AND PLAN: 46 y/o female with h/o morbid obesity and DM, presenting with abdominal pain, elevated liver enzymes / hyperbilirubinemia, with cholelithiasis and a dilated CBD on US. She very likely has or had choledocholithiasis given bili of 6 and mildly dilated CBD on US. It's possible she passed a stone given her pain has resolved with NPO status, however she has very strong predictors (bili > 4 and dilated CBD) for suspected choledocholithiasis and meets criteria to proceed directly for ERCP at this time.   I discussed the risks / benefits of ERCP with her, to include risk of pancreatitis, bleeding, and perforation. She agreed with the plan and wanted to proceed. I discussed that once this has been done, she warrants cholecystectomy to prevent recurrence. Please consult general surgery for cholecystectomy to be done during this admission.   Otherwise WBC normal, lipase normal, no need for antibiotics. Please hold further lovenox until ERCP is performed. Tentatively scheduled for 1PM today. NPO until that time.  Please call with questions / concerns.   Ileene PatrickSteven Jahdiel Krol, MD [facility] Pager [phone]

## 2017-05-02 NOTE — Care Management Note (Signed)
Case Management Note  Patient Details  Name: Tiburcio Bashasha Orren MRN: [id number] Date of Birth: Nov 08, 1971  Subjective/Objective:                  45[facility] with 3 days of worsening epigastric pain. Patient states that for the last month she has had intermittent postprandial epigastric pain. At first the pain would resolve without intervention, but on Tuesday the pain became constant and severe. She had associated nausea. [facility]S reportedly showed CBD of 9mm and gallstones without evidence of cholecystitis or definitive choledocholithiasis. Initially WBC was 4.5, Hgb 13.8. [facility] showed bilirubin of 6.2 (previously 4.5), ALT 433 (previously 570), AST 324 (previously 522). Lipase normal. Transfer was initiated due to need for MRCP and/or ERCP. She is scheduled for ERCP today with Dr. Adela LankArmbruster. Currently states that she has no abdominal pain.   Action/Plan: [id number]/Rhonda Earlene Plateravis, BSN, RN3,CCM/[phone]: Chart reviewed for updates and status. CM following for discharge needs. Cm following for progression of hospital stay:  Expected Discharge Date:                  Expected Discharge Plan:  Home/Self Care  In-House Referral:     Discharge planning Services  CM Consult  Post Acute Care Choice:    Choice offered to:     DME Arranged:    DME Agency:     HH Arranged:    HH Agency:     Status of Service:  In process, will continue to follow  If discussed at Long Length of Stay Meetings, dates discussed:    Additional Comments:  Golda AcreDavis, Rhonda Lynn, RN 05/02/2017, 9:53 AM

## 2017-05-02 NOTE — Interval H&P Note (Signed)
History and Physical Interval Note:  05/02/2017 1:16 PM  Natalie Benjamin  has presented today for surgery, with the diagnosis of choledocholithiasis  The various methods of treatment have been discussed with the patient and family. After consideration of risks, benefits and other options for treatment, the patient has consented to  Procedure(s): ENDOSCOPIC RETROGRADE CHOLANGIOPANCREATOGRAPHY (ERCP) (N/A) as a surgical intervention .  The patient's history has been reviewed, patient examined, no change in status, stable for surgery.  I have reviewed the patient's chart and labs.  Questions were answered to the patient's satisfaction.     Venita LickMalcolm T. Russella DarStark

## 2017-05-02 NOTE — Anesthesia Preprocedure Evaluation (Signed)
Anesthesia Evaluation  Patient identified by MRN, date of birth, ID band Patient awake    Reviewed: Allergy & Precautions, NPO status , Patient's Chart, lab work & pertinent test results  Airway Mallampati: II  TM Distance: <3 FB Neck ROM: Full    Dental no notable dental hx.    Pulmonary neg pulmonary ROS,    Pulmonary exam normal breath sounds clear to auscultation       Cardiovascular hypertension, Normal cardiovascular exam Rhythm:Regular Rate:Normal     Neuro/Psych negative neurological ROS  negative psych ROS   GI/Hepatic negative GI ROS, Neg liver ROS,   Endo/Other  diabetesMorbid obesity  Renal/GU negative Renal ROS  negative genitourinary   Musculoskeletal negative musculoskeletal ROS (+)   Abdominal   Peds negative pediatric ROS (+)  Hematology negative hematology ROS (+)   Anesthesia Other Findings   Reproductive/Obstetrics negative OB ROS                             Anesthesia Physical Anesthesia Plan  ASA: III  Anesthesia Plan: General   Post-op Pain Management:    Induction: Intravenous  Airway Management Planned: Oral ETT  Additional Equipment:   Intra-op Plan:   Post-operative Plan: Extubation in OR  Informed Consent: I have reviewed the patients History and Physical, chart, labs and discussed the procedure including the risks, benefits and alternatives for the proposed anesthesia with the patient or authorized representative who has indicated his/her understanding and acceptance.   Dental advisory given  Plan Discussed with: CRNA and Surgeon  Anesthesia Plan Comments:         Anesthesia Quick Evaluation  

## 2017-05-02 NOTE — Anesthesia Postprocedure Evaluation (Signed)
Anesthesia Post Note  Patient: Natalie Benjamin  Procedure(s) Performed: Procedure(s) (LRB): ENDOSCOPIC RETROGRADE CHOLANGIOPANCREATOGRAPHY (ERCP) (N/A)  Patient location during evaluation: PACU Anesthesia Type: General Level of consciousness: awake and alert Pain management: pain level controlled Vital Signs Assessment: post-procedure vital signs reviewed and stable Respiratory status: spontaneous breathing, nonlabored ventilation and respiratory function stable Cardiovascular status: blood pressure returned to baseline and stable Postop Assessment: no signs of nausea or vomiting Anesthetic complications: no       Last Vitals:  Vitals:   05/02/17 1445 05/02/17 1500  BP: (!) 158/92 (!) 153/87  Pulse: 83 80  Resp: 14 18  Temp:      Last Pain:  Vitals:   05/02/17 1430  TempSrc: Oral  PainSc:                  Natalie Benjamin

## 2017-05-02 NOTE — Transfer of Care (Signed)
Immediate Anesthesia Transfer of Care Note  Patient: Natalie Benjamin  Procedure(s) Performed: Procedure(s): ENDOSCOPIC RETROGRADE CHOLANGIOPANCREATOGRAPHY (ERCP) (N/A)  Patient Location: PACU  Anesthesia Type:General  Level of Consciousness: awake, alert  and oriented  Airway & Oxygen Therapy: Patient Spontanous Breathing and Patient connected to face mask oxygen  Post-op Assessment: Report given to RN and Post -op Vital signs reviewed and stable  Post vital signs: Reviewed and stable  Last Vitals:  Vitals:   05/02/17 1243 05/02/17 1430  BP: (!) 156/86 (!) 159/92  Pulse: 83 87  Resp: (!) 9 10  Temp: 36.6 C 36.7 C    Last Pain:  Vitals:   05/02/17 1430  TempSrc: Oral  PainSc:       Patients Stated Pain Goal: 0 (05/02/17 1017)  Complications: No apparent anesthesia complications

## 2017-05-02 NOTE — Consult Note (Signed)
[facility] Surgery Consult Note  Natalie Benjamin 1971/08/26  [id number].    Requesting MD: Havery Moros Chief Complaint/Reason for Consult: Choledocholithiasis  HPI:  Natalie Benjamin is a 46yo female admitted to [facility] 5/23 with 3 days of worsening epigastric pain. Patient states that for the last month she has had intermittent postprandial epigastric pain. At first the pain would resolve without intervention, but on Tuesday the pain became constant and severe. She had associated nausea. She went to [facility] where U/S reportedly showed CBD of 62m and gallstones without evidence of cholecystitis or definitive choledocholithiasis. Initially WBC was 4.5, Hgb 13.8. [facility] ALT 433 (previously 570), AST 324 (previously 522). Lipase normal. Transfer was initiated due to need for MRCP and/or ERCP. She is scheduled for ERCP today with Dr. AHavery Moros Currently states that she has no abdominal pain.   PMH significant for HTN, DM, morbid obesity Surgical history: c section, partial left lobectomy  Nonsmoker Employment: works in transportation for [employer] ROS: Review of Systems  Constitutional: Negative.   HENT: Negative.   Eyes: Negative.   Respiratory: Negative.   Cardiovascular: Negative.   Gastrointestinal: Positive for abdominal pain and nausea. Negative for blood in stool, constipation, diarrhea, heartburn, melena and vomiting.  Genitourinary: Negative.   Musculoskeletal: Negative.   Skin: Negative.   Neurological: Negative.   All systems reviewed and otherwise negative except for as above  Family History  Problem Relation Age of Onset  . Family history unknown: Yes    Past Medical History:  Diagnosis Date  . [place]   . GERD (gastroesophageal reflux disease)   . Neuromuscular disorder ([facility]     Past Surgical History:  Procedure Laterality Date  . cataract  surgery     . CESAREAN SECTION    . IUD PLACEMENT     . left lobectomy       Social History:  reports that she has never smoked. She has never used smokeless tobacco. She reports that she does not drink alcohol or use drugs.  Allergies:  Allergies  Allergen Reactions  . Ciprofloxacin In D5w Nausea And Vomiting  . Ibuprofen Other (See Comments)    Stomach cramps  . Percocet [Oxycodone-Acetaminophen] Other (See Comments)    Stomach cramps    Medications Prior to Admission  Medication Sig Dispense Refill  . glipiZIDE (GLUCOTROL) 5 MG tablet Take 5 mg by mouth 2 (two) times daily before a meal.    . insulin NPH Human (HUMULIN N,NOVOLIN N) 100 UNIT/ML injection Inject 40 Units into the skin 2 (two) times daily.    . metFORMIN (GLUCOPHAGE) 1000 MG tablet Take 1,000 mg by mouth 2 (two) times daily with a meal.      Prior to Admission medications   Medication Sig Start Date End Date Taking? Authorizing Provider  glipiZIDE (GLUCOTROL) 5 MG tablet Take 5 mg by mouth 2 (two) times daily before a meal.   Yes [provider]  insulin NPH Human (HUMULIN N,NOVOLIN N) 100 UNIT/ML injection Inject 40 Units into the skin 2 (two) times daily.   Yes [provider]  metFORMIN (GLUCOPHAGE) 1000 MG tablet Take 1,000 mg by mouth 2 (two) times daily with a meal.   Yes [provider]    Blood pressure 136/88, pulse 97, temperature 98.2 F (36.8 C), temperature source Oral, resp. rate 18, height 5' 4"  (1.626 m), weight 273 lb (123.8 kg), last menstrual period  03/28/2017, SpO2 98 %. Physical Exam: General: pleasant, WD/WN AA female who is laying in bed in NAD HEENT: head is normocephalic, atraumatic.  Sclera are noninjected.  Pupils equal and round.  Ears and nose without any masses or lesions.  Mouth is pink and moist. Dentition fair Heart: regular, rate, and rhythm.  No obvious murmurs, gallops, or rubs noted.  Palpable pedal pulses bilaterally Lungs: CTAB, no wheezes,  rhonchi, or rales noted.  Respiratory effort nonlabored Abd: obese, well healed lower transverse incision, soft, NT/ND, +BS, no masses, hernias, or organomegaly MS: all 4 extremities are symmetrical with no cyanosis, clubbing, or edema. Skin: warm and dry with no masses, lesions, or rashes Psych: A&Ox3 with an appropriate affect. Neuro: cranial nerves grossly intact, extremity CSM intact bilaterally, normal speech  Results for orders placed or performed during the hospital encounter of 05/01/17 (from the past 48 hour(s))  CBC with Differential/Platelet     Status: None   Collection Time: 05/02/17  1:06 AM  Result Value Ref Range   WBC 4.4 4.0 - 10.5 K/uL   RBC 4.60 3.87 - 5.11 MIL/uL   Hemoglobin 12.7 12.0 - 15.0 g/dL   HCT 36.5 36.0 - 46.0 %   MCV 79.3 78.0 - 100.0 fL   MCH 27.6 26.0 - 34.0 pg   MCHC 34.8 30.0 - 36.0 g/dL   RDW 14.1 11.5 - 15.5 %   Platelets 234 150 - 400 K/uL   Neutrophils Relative % 61 %   Neutro Abs 2.7 1.7 - 7.7 K/uL   Lymphocytes Relative 29 %   Lymphs Abs 1.3 0.7 - 4.0 K/uL   Monocytes Relative 8 %   Monocytes Absolute 0.3 0.1 - 1.0 K/uL   Eosinophils Relative 2 %   Eosinophils Absolute 0.1 0.0 - 0.7 K/uL   Basophils Relative 0 %   Basophils Absolute 0.0 0.0 - 0.1 K/uL  Comprehensive metabolic panel     Status: Abnormal   Collection Time: 05/02/17  1:06 AM  Result Value Ref Range   Sodium 134 (L) 135 - 145 mmol/L   Potassium 3.6 3.5 - 5.1 mmol/L   Chloride 100 (L) 101 - 111 mmol/L   CO2 26 22 - 32 mmol/L   Glucose, Bld 196 (H) 65 - 99 mg/dL   BUN 9 6 - 20 mg/dL   Creatinine, Ser 0.70 0.44 - 1.00 mg/dL   Calcium 8.9 8.9 - 10.3 mg/dL   Total Protein 7.8 6.5 - 8.1 g/dL   Albumin 3.7 3.5 - 5.0 g/dL   AST 324 (H) 15 - 41 U/L   ALT 433 (H) 14 - 54 U/L   Alkaline Phosphatase 196 (H) 38 - 126 U/L   Total Bilirubin 6.2 (H) 0.3 - 1.2 mg/dL   GFR calc non Af Amer >60 >60 mL/min   GFR calc Af Amer >60 >60 mL/min    Comment: (NOTE) The eGFR has been  calculated using the CKD EPI equation. This calculation has not been validated in all clinical situations. eGFR's persistently <60 mL/min signify possible Chronic Kidney Disease.    Anion gap 8 5 - 15  Lipase, blood     Status: None   Collection Time: 05/02/17  1:06 AM  Result Value Ref Range   Lipase 32 11 - 51 U/L  Glucose, capillary     Status: Abnormal   Collection Time: 05/02/17  4:08 AM  Result Value Ref Range   Glucose-Capillary 173 (H) 65 - 99 mg/dL  Glucose, capillary  Status: Abnormal   Collection Time: 05/02/17  7:29 AM  Result Value Ref Range   Glucose-Capillary 183 (H) 65 - 99 mg/dL   No results found.  Anti-infectives    None       Assessment/Plan Possible choledocholithiasis Symptomatic cholelithiasis - 3 days of severe epigastric pain - U/S at outside hospital reportedly showed CBD of 51m and gallstones without evidence of cholecystitis or definitive choledocholithiasis - Initially WBC 4.5, Hgb 13.8. [facility] 4.5), ALT 433 (previously 570), AST 324 (previously 522). Lipase WNL - scheduled for ERCP today with Dr. AHavery Moros HTN DM Morbid obesity  ID - none VTE - SCDs FEN - IVF, NPO for procedure  Plan - ERCP today. Will tentatively plan for lap chole tomorrow. [facility] 05/02/2017, 8:36 AM Pager: [phone]: [phone]-Fri 7:00 am-4:30 pm Sat-Sun 7:00 am-11:30 am

## 2017-05-02 NOTE — Progress Notes (Signed)
Lovenox per Pharmacy for DVT Prophylaxis    Pharmacy has been consulted from dosing enoxaparin (lovenox) in this patient for DVT prophylaxis.  The pharmacist has reviewed pertinent labs (Hgb _12.7__; PLT_234__), patient weight (_123.8__kg) and renal function (CrCl___mL/min) and decided that enoxaparin _60_mg SQ Q24Hrs is appropriate for this patient.  The pharmacy department will sign off at this time.  Please reconsult pharmacy if status changes or for further issues.  Thank you  Luetta NuttingJulian Crowford Louanne Calvillo, Jr PharmD, BCPS  05/02/2017, 1:59 AM

## 2017-05-02 NOTE — Anesthesia Procedure Notes (Signed)
Procedure Name: Intubation Date/Time: 05/02/2017 1:26 PM Performed by: Noralyn Pick D Pre-anesthesia Checklist: Patient identified, Emergency Drugs available, Suction available and Patient being monitored Patient Re-evaluated:Patient Re-evaluated prior to inductionOxygen Delivery Method: Circle system utilized Preoxygenation: Pre-oxygenation with 100% oxygen Intubation Type: IV induction Ventilation: Mask ventilation without difficulty Laryngoscope Size: Mac and 4 Grade View: Grade I Tube type: Oral Tube size: 7.5 mm Number of attempts: 1 Airway Equipment and Method: Stylet Placement Confirmation: ETT inserted through vocal cords under direct vision,  positive ETCO2 and breath sounds checked- equal and bilateral Tube secured with: Tape Dental Injury: Teeth and Oropharynx as per pre-operative assessment

## 2017-05-02 NOTE — Op Note (Signed)
[facility] Patient Name: Natalie Benjamin Procedure Date: 05/02/2017 MRN: [id number] Attending MD: Meryl Dare , MD Date of Birth: November 06, 1971 CSN: [id number] Age: 46 Admit Type: Outpatient Procedure:                ERCP Indications:              Abdominal pain of suspected biliary origin,                            Suspected bile duct stone(s), Elevated liver                            enzymes, Elevated bilirubin Providers:                Venita Lick. Russella Dar, MD, Dwain Sarna, RN, Zoila Shutter, Technician Referring MD:             [facility] Medicines:                General Anesthesia Complications:            No immediate complications. Estimated Blood Loss:     Estimated blood loss: none. Procedure:                Pre-Anesthesia Assessment:                           - Prior to the procedure, a History and Physical                            was performed, and patient medications and                            allergies were reviewed. The patient's tolerance of                            previous anesthesia was also reviewed. The risks                            and benefits of the procedure and the sedation                            options and risks were discussed with the patient.                            All questions were answered, and informed consent                            was obtained. Prior Anticoagulants: The patient has                            taken no previous anticoagulant or antiplatelet                            agents. ASA Grade Assessment:  II - A patient with                            mild systemic disease. After reviewing the risks                            and benefits, the patient was deemed in                            satisfactory condition to undergo the procedure.                           After obtaining informed consent, the scope was                            passed under direct vision.  Throughout the                            procedure, the patient's blood pressure, pulse, and                            oxygen saturations were monitored continuously. The                            was introduced through the mouth, and used to                            inject contrast into and used to inject contrast                            into the bile duct. The ERCP was accomplished                            without difficulty. The patient tolerated the                            procedure well. Scope In: Scope Out: Findings:      The scout film was normal. The esophagus was successfully intubated       under direct vision. The scope was advanced to a normal major papilla in       the descending duodenum without detailed examination of the pharynx,       larynx and associated structures, and upper GI tract. The upper GI tract       was grossly normal. The bile duct was deeply cannulated with a Jagwire       and then the short-nosed traction sphincterotome. Contrast was injected.       I personally interpreted the bile duct images. There was appropriate       flow of contrast through the bile ducts. The common bile duct was       diffusely dilated, with stones causing obstruction. The cystic duct and       gallbladder were partially filled. The intrahepatic ducts appeared       normal. The largest diameter of the CBD was 11 mm. The common bile duct  contained multiple stones, the largest of which was 6 mm in diameter. An       8 mm biliary sphincterotomy was made with a traction (standard)       sphincterotome using ERBE electrocautery. There was no       post-sphincterotomy bleeding. The biliary tree was swept several times       with a 12 mm balloon starting at the bifurcation. Many stones were       removed, approximately 15. No stones remained following last balloon       sweep. Excellent biliary drainage post stone extractions. The PD was not       cannulated or  injected by intention. Impression:               - The common bile duct was dilated, with stones                            causing obstruction.                           - Choledocholithiasis was found, approximately 15                            stones. Complete removal was accomplished by                            biliary sphincterotomy and balloon extraction.                           - A biliary sphincterotomy was performed.                           - The biliary tree was swept multiple times. Moderate Sedation:      N/A- Per Anesthesia Care Recommendation:           - Avoid aspirin and nonsteroidal anti-inflammatory                            medicines for 1 week.                           - Return patient to hospital ward for ongoing care.                           - Trend LFTs in hospital and repeat LFTs in 1 month                            if they remain abnormal at discharge. Procedure Code(s):        --- Professional ---                           [phone], Endoscopic retrograde                            cholangiopancreatography (ERCP); with removal of                            calculi/debris  from biliary/pancreatic duct(s)                           [phone], Endoscopic retrograde                            cholangiopancreatography (ERCP); with                            sphincterotomy/papillotomy Diagnosis Code(s):        --- Professional ---                           K80.51, Calculus of bile duct without cholangitis                            or cholecystitis with obstruction                           R10.9, Unspecified abdominal pain                           R74.8, Abnormal levels of other serum enzymes                           E80.7, Disorder of bilirubin metabolism, unspecified CPT copyright 2016 American Medical Association. All rights reserved. The codes documented in this report are preliminary and upon coder review may  be revised to meet current compliance  requirements. Meryl Dare, MD 05/02/2017 2:16:26 PM This report has been signed electronically. Number of Addenda: 0

## 2017-05-02 NOTE — Progress Notes (Signed)
Report from Sunrisehasity, Charity fundraiserN. Care assumed for pt at this time. Pt has just returned from endoscopy. Pt drowsy, but awakens on calling. Oriented x 4. Denies pain/discomfort. POC discussed w/ pt and family at bedside. Will monitor.

## 2017-05-03 ENCOUNTER — Encounter (HOSPITAL_COMMUNITY): Payer: Self-pay

## 2017-05-03 ENCOUNTER — Inpatient Hospital Stay (HOSPITAL_COMMUNITY): Payer: BLUE CROSS/BLUE SHIELD | Admitting: Anesthesiology

## 2017-05-03 ENCOUNTER — Encounter (HOSPITAL_COMMUNITY)
Admission: AD | Disposition: A | Discharge status: Home / Self Care | Payer: Self-pay | Source: Other Acute Inpatient Hospital | Attending: Internal Medicine

## 2017-05-03 HISTORY — PX: CHOLECYSTECTOMY: SHX55

## 2017-05-03 LAB — CBC
HCT: 35.8 % — ABNORMAL LOW (ref 36.0–46.0)
Hemoglobin: 12.3 g/dL (ref 12.0–15.0)
MCH: 27.5 pg (ref 26.0–34.0)
MCHC: 34.4 g/dL (ref 30.0–36.0)
MCV: 80.1 fL (ref 78.0–100.0)
Platelets: 250 10*3/uL (ref 150–400)
RBC: 4.47 MIL/uL (ref 3.87–5.11)
RDW: 14 % (ref 11.5–15.5)
WBC: 4.2 10*3/uL (ref 4.0–10.5)

## 2017-05-03 LAB — COMPREHENSIVE METABOLIC PANEL
ALT: 298 U/L — ABNORMAL HIGH (ref 14–54)
AST: 140 U/L — ABNORMAL HIGH (ref 15–41)
Albumin: 3.7 g/dL (ref 3.5–5.0)
Alkaline Phosphatase: 197 U/L — ABNORMAL HIGH (ref 38–126)
Anion gap: 10 (ref 5–15)
BUN: 7 mg/dL (ref 6–20)
CO2: 24 mmol/L (ref 22–32)
Calcium: 8.5 mg/dL — ABNORMAL LOW (ref 8.9–10.3)
Chloride: 103 mmol/L (ref 101–111)
Creatinine, Ser: 0.79 mg/dL (ref 0.44–1.00)
GFR calc Af Amer: >60 mL/min (ref >60)
GFR calc non Af Amer: >60 mL/min (ref >60)
Glucose, Bld: 165 mg/dL — ABNORMAL HIGH (ref 65–99)
Potassium: 3.6 mmol/L (ref 3.5–5.1)
Sodium: 137 mmol/L (ref 135–145)
Total Bilirubin: 2.6 mg/dL — ABNORMAL HIGH (ref 0.3–1.2)
Total Protein: 7.3 g/dL (ref 6.5–8.1)

## 2017-05-03 LAB — GLUCOSE, CAPILLARY
Glucose-Capillary: 161 mg/dL — ABNORMAL HIGH (ref 65–99)
Glucose-Capillary: 166 mg/dL — ABNORMAL HIGH (ref 65–99)
Glucose-Capillary: 324 mg/dL — ABNORMAL HIGH (ref 65–99)

## 2017-05-03 LAB — LIPASE, BLOOD: Lipase: 39 U/L (ref 11–51)

## 2017-05-03 SURGERY — LAPAROSCOPIC CHOLECYSTECTOMY WITH INTRAOPERATIVE CHOLANGIOGRAM
Anesthesia: General

## 2017-05-03 SURGERY — LAPAROSCOPIC CHOLECYSTECTOMY
Anesthesia: General | Site: Abdomen

## 2017-05-03 MED ORDER — ONDANSETRON HCL 4 MG/2ML IJ SOLN: 4.0000 mg | Freq: Once | INTRAMUSCULAR | Status: DC | PRN

## 2017-05-03 MED ORDER — SUCCINYLCHOLINE CHLORIDE 200 MG/10ML IV SOSY
PREFILLED_SYRINGE | INTRAVENOUS | Status: DC | PRN
  Administered 2017-05-03: 140 mg via INTRAVENOUS

## 2017-05-03 MED ORDER — FENTANYL CITRATE (PF) 100 MCG/2ML IJ SOLN
25.0000 ug | INTRAMUSCULAR | Status: DC | PRN
  Administered 2017-05-03: 50 ug via INTRAVENOUS

## 2017-05-03 MED ORDER — LACTATED RINGERS IR SOLN
Status: DC | PRN
  Administered 2017-05-03: 1000 mL

## 2017-05-03 MED ORDER — DEXTROSE 5 % IV SOLN
2.0000 g | INTRAVENOUS | Status: AC
  Administered 2017-05-03: 2 g via INTRAVENOUS

## 2017-05-03 MED ORDER — PROPOFOL 10 MG/ML IV BOLUS
INTRAVENOUS | Status: AC
  Filled 2017-05-03: qty 20

## 2017-05-03 MED ORDER — SUGAMMADEX SODIUM 200 MG/2ML IV SOLN
INTRAVENOUS | Status: DC | PRN
  Administered 2017-05-03: 300 mg via INTRAVENOUS

## 2017-05-03 MED ORDER — DEXAMETHASONE SODIUM PHOSPHATE 10 MG/ML IJ SOLN
INTRAMUSCULAR | Status: AC
  Filled 2017-05-03: qty 1

## 2017-05-03 MED ORDER — PHENYLEPHRINE 40 MCG/ML (10ML) SYRINGE FOR IV PUSH (FOR BLOOD PRESSURE SUPPORT)
PREFILLED_SYRINGE | INTRAVENOUS | Status: DC | PRN
  Administered 2017-05-03 (×5): 80 ug via INTRAVENOUS

## 2017-05-03 MED ORDER — DEXAMETHASONE SODIUM PHOSPHATE 10 MG/ML IJ SOLN
INTRAMUSCULAR | Status: DC | PRN
  Administered 2017-05-03: 10 mg via INTRAVENOUS

## 2017-05-03 MED ORDER — IOPAMIDOL (ISOVUE-300) INJECTION 61%
INTRAVENOUS | Status: AC
  Filled 2017-05-03: qty 50

## 2017-05-03 MED ORDER — LIDOCAINE 2% (20 MG/ML) 5 ML SYRINGE
INTRAMUSCULAR | Status: DC | PRN
  Administered 2017-05-03: 100 mg via INTRAVENOUS

## 2017-05-03 MED ORDER — ONDANSETRON HCL 4 MG/2ML IJ SOLN
INTRAMUSCULAR | Status: DC | PRN
  Administered 2017-05-03: 4 mg via INTRAVENOUS

## 2017-05-03 MED ORDER — 0.9 % SODIUM CHLORIDE (POUR BTL) OPTIME
TOPICAL | Status: DC | PRN
  Administered 2017-05-03: 1000 mL

## 2017-05-03 MED ORDER — SUCCINYLCHOLINE CHLORIDE 200 MG/10ML IV SOSY
PREFILLED_SYRINGE | INTRAVENOUS | Status: AC
  Filled 2017-05-03: qty 10

## 2017-05-03 MED ORDER — PHENYLEPHRINE 40 MCG/ML (10ML) SYRINGE FOR IV PUSH (FOR BLOOD PRESSURE SUPPORT)
PREFILLED_SYRINGE | INTRAVENOUS | Status: AC
  Filled 2017-05-03: qty 10

## 2017-05-03 MED ORDER — BUPIVACAINE HCL (PF) 0.25 % IJ SOLN
INTRAMUSCULAR | Status: DC | PRN
  Administered 2017-05-03: 20 mL

## 2017-05-03 MED ORDER — MIDAZOLAM HCL 2 MG/2ML IJ SOLN
INTRAMUSCULAR | Status: AC
  Filled 2017-05-03: qty 2

## 2017-05-03 MED ORDER — BUPIVACAINE HCL (PF) 0.25 % IJ SOLN
INTRAMUSCULAR | Status: AC
  Filled 2017-05-03: qty 30

## 2017-05-03 MED ORDER — FENTANYL CITRATE (PF) 250 MCG/5ML IJ SOLN
INTRAMUSCULAR | Status: AC
  Filled 2017-05-03: qty 5

## 2017-05-03 MED ORDER — ROCURONIUM BROMIDE 10 MG/ML (PF) SYRINGE
PREFILLED_SYRINGE | INTRAVENOUS | Status: DC | PRN
  Administered 2017-05-03: 50 mg via INTRAVENOUS
  Administered 2017-05-03 (×2): 10 mg via INTRAVENOUS

## 2017-05-03 MED ORDER — FENTANYL CITRATE (PF) 100 MCG/2ML IJ SOLN
INTRAMUSCULAR | Status: AC
  Administered 2017-05-03: 50 ug via INTRAVENOUS
  Filled 2017-05-03: qty 2

## 2017-05-03 MED ORDER — ONDANSETRON HCL 4 MG/2ML IJ SOLN
INTRAMUSCULAR | Status: AC
  Filled 2017-05-03: qty 2

## 2017-05-03 MED ORDER — LIDOCAINE 2% (20 MG/ML) 5 ML SYRINGE
INTRAMUSCULAR | Status: AC
  Filled 2017-05-03: qty 5

## 2017-05-03 MED ORDER — PROPOFOL 10 MG/ML IV BOLUS
INTRAVENOUS | Status: DC | PRN
  Administered 2017-05-03: 200 mg via INTRAVENOUS

## 2017-05-03 MED ORDER — HYDROCODONE-ACETAMINOPHEN 7.5-325 MG PO TABS: 1.0000 | ORAL_TABLET | Freq: Four times a day (QID) | ORAL | Status: DC | PRN

## 2017-05-03 MED ORDER — ROCURONIUM BROMIDE 50 MG/5ML IV SOSY
PREFILLED_SYRINGE | INTRAVENOUS | Status: AC
  Filled 2017-05-03: qty 5

## 2017-05-03 MED ORDER — CHLORHEXIDINE GLUCONATE CLOTH 2 % EX PADS
6.0000 | MEDICATED_PAD | Freq: Once | CUTANEOUS | Status: AC
  Administered 2017-05-03: 6 via TOPICAL

## 2017-05-03 MED ORDER — SUGAMMADEX SODIUM 500 MG/5ML IV SOLN
INTRAVENOUS | Status: AC
  Filled 2017-05-03: qty 5

## 2017-05-03 MED ORDER — DEXTROSE 5 % IV SOLN
INTRAVENOUS | Status: AC
  Filled 2017-05-03: qty 2

## 2017-05-03 MED ORDER — MIDAZOLAM HCL 2 MG/2ML IJ SOLN
INTRAMUSCULAR | Status: DC | PRN
  Administered 2017-05-03: 2 mg via INTRAVENOUS

## 2017-05-03 MED ORDER — FENTANYL CITRATE (PF) 100 MCG/2ML IJ SOLN
INTRAMUSCULAR | Status: DC | PRN
  Administered 2017-05-03: 50 ug via INTRAVENOUS
  Administered 2017-05-03: 150 ug via INTRAVENOUS
  Administered 2017-05-03: 50 ug via INTRAVENOUS

## 2017-05-03 SURGICAL SUPPLY — 34 items
APPLIER CLIP ROT 10 11.4 M/L (STAPLE) ×3
CABLE HIGH FREQUENCY MONO STRZ (ELECTRODE) ×3 IMPLANT
CHLORAPREP W/TINT 26ML (MISCELLANEOUS) ×6 IMPLANT
CLIP APPLIE ROT 10 11.4 M/L (STAPLE) ×1 IMPLANT
COVER MAYO STAND STRL (DRAPES) IMPLANT
COVER SURGICAL LIGHT HANDLE (MISCELLANEOUS) ×3 IMPLANT
DECANTER SPIKE VIAL GLASS SM (MISCELLANEOUS) IMPLANT
DERMABOND ADVANCED (GAUZE/BANDAGES/DRESSINGS) ×2
DERMABOND ADVANCED .7 DNX12 (GAUZE/BANDAGES/DRESSINGS) ×1 IMPLANT
DRAPE C-ARM 42X120 X-RAY (DRAPES) IMPLANT
ELECT REM PT RETURN 15FT ADLT (MISCELLANEOUS) ×3 IMPLANT
GLOVE BIO SURGEON STRL SZ 6 (GLOVE) ×3 IMPLANT
GLOVE INDICATOR 6.5 STRL GRN (GLOVE) ×3 IMPLANT
GOWN STRL REUS W/TWL LRG LVL3 (GOWN DISPOSABLE) ×6 IMPLANT
GOWN STRL REUS W/TWL XL LVL3 (GOWN DISPOSABLE) ×3 IMPLANT
GRASPER SUT TROCAR 14GX15 (MISCELLANEOUS) ×3 IMPLANT
HEMOSTAT SNOW SURGICEL 2X4 (HEMOSTASIS) ×3 IMPLANT
HOVERMATT SINGLE USE (MISCELLANEOUS) ×3 IMPLANT
IRRIG SUCT STRYKERFLOW 2 WTIP (MISCELLANEOUS)
IRRIGATION SUCT STRKRFLW 2 WTP (MISCELLANEOUS) IMPLANT
KIT BASIN OR (CUSTOM PROCEDURE TRAY) ×3 IMPLANT
NEEDLE INSUFFLATION 14GA 120MM (NEEDLE) ×3 IMPLANT
POUCH SPECIMEN RETRIEVAL 10MM (ENDOMECHANICALS) ×3 IMPLANT
SCISSORS LAP 5X35 DISP (ENDOMECHANICALS) ×3 IMPLANT
SET CHOLANGIOGRAPH MIX (MISCELLANEOUS) IMPLANT
SET IRRIG TUBING LAPAROSCOPIC (IRRIGATION / IRRIGATOR) ×3 IMPLANT
SLEEVE XCEL OPT CAN 5 100 (ENDOMECHANICALS) ×9 IMPLANT
SUT MNCRL AB 4-0 PS2 18 (SUTURE) ×3 IMPLANT
TOWEL OR 17X26 10 PK STRL BLUE (TOWEL DISPOSABLE) ×3 IMPLANT
TOWEL OR NON WOVEN STRL DISP B (DISPOSABLE) IMPLANT
TRAY LAPAROSCOPIC (CUSTOM PROCEDURE TRAY) ×3 IMPLANT
TROCAR BLADELESS OPT 5 100 (ENDOMECHANICALS) ×3 IMPLANT
TROCAR XCEL 12X100 BLDLESS (ENDOMECHANICALS) ×3 IMPLANT
TUBING INSUF HEATED (TUBING) ×3 IMPLANT

## 2017-05-03 NOTE — Op Note (Signed)
Operative Note  Debbie Pacheco 46 y.o. female [id number]  05/03/2017  Surgeon: Berna Buehelsea A Cashawn Pacheco   Assistant: OR Staff  Procedure performed: Laparoscopic Cholecystectomy  Preop diagnosis: Choledocholithiasis s/p ERCP Post-op diagnosis/intraop findings: chronic cholecytitis  Specimens: gallbladder  EBL: 50cc  Complications: none  Description of procedure: After obtaining informed consent the patient was brought to the operating room. Prophylactic antibiotics and subcutaneous heparin were administered. SCD's were applied. General endotracheal anesthesia was initiated and a formal time-out was performed. The abdomen was prepped and draped in the usual sterile fashion and the abdomen was entered using visiport technique in the left upper quadrant after instilling the site with local. Insufflation to 15mmHg was obtained and gross inspection revealed no evidence of injury from our entry. Of note she did have intra-abdominal omental adhesive disease extending from a few centimeters superior to the umbilicus down towards the pelvis most prominent in the midline, but also some adhesions in the right upper quadrant to the gallbladder and in the left upper quadrant to the spleen. Three 5mm trocars were introduced in the supraumbilical, right midclavicular and right anterior axillary lines under direct visualization and following infiltration with local. A 12 mm trocar was placed in the epigastrium. The gallbladder was encased in dense omental adhesions which were carefully divided with cautery and blunt dissection in order to expose the gallbladder. She had a extremely enlarged and fatty liver with rounded edge in addition to severe intra-abdominal obesity which made the case particularly difficult. Once the gallbladder was exposed, the fundus was retracted cephalad and the infundibulum was retracted laterally. A combination of hook electrocautery and blunt dissection was utilized to clear the peritoneum from  the neck and cystic duct, circumferentially isolating the cystic artery and cystic duct and lifting the gallbladder from the cystic plate. The cystic duct was short and wide; there were changes of chronic inflammation throughout. The critical view of safety was achieved with the cystic artery, cystic duct, and liver bed visualized between them with no other structures. The artery was clipped with a single clip proximally and distally and divided as was the cystic duct with two clips on the proximal end. The gallbladder was dissected from the liver plate using electrocautery. Once freed the gallbladder was placed in an endocatch bag and removed through the epigastric trocar site. A small amount of bleeding on the liver bed was controlled with cautery. Some bile and numerous smooth round stones had been spilled from the gallbladder during its dissection from the liver bed. These were carefully and meticulously aspirated and the right upper quadrant was irrigated copiously until the effluent was clear. Hemostasis was once again confirmed, and reinspection of the abdomen revealed no injuries. The clips were well opposed without any bile leak from the duct or the liver bed. Surgicel [name] was placed in the liver bed. The abdomen was desufflated and all trocars removed. The skin incisions were closed with running subcuticular monocryl and Dermabond. The patient was awakened, extubated and transported to the recovery room in stable condition.   All counts were correct at the completion of the case.

## 2017-05-03 NOTE — Progress Notes (Signed)
      Progress Note   Subjective  Patient did well overnight. ERCP yesterday with multiple stones removed from the CBD.  Patient denies pain, tolerated clears last night.     Objective   Vital signs in last 24 hours: Temp:  [97.9 F (36.6 C)-98.7 F (37.1 C)] 98.7 F (37.1 C) (05/25 0535) Pulse Rate:  [80-93] 91 (05/25 0535) Resp:  [9-18] 18 (05/25 0535) BP: (125-159)/(79-92) 125/81 (05/25 0535) SpO2:  [95 %-100 %] 100 % (05/25 0535) Last BM Date: 05/02/17 General:    AA female in NAD Heart:  Regular rate and rhythm; no murmurs Lungs: Respirations even and unlabored, lungs CTA bilaterally Abdomen:  Soft, nontender .  Extremities:  Without edema. Neurologic:  Alert and oriented,  grossly normal neurologically. Psych:  Cooperative. Normal mood and affect.  Intake/Output from previous day: 05/24 0701 - 05/25 0700 In: 1950 [P.O.:500; I.V.:1450] Out: -  Intake/Output this shift: No intake/output data recorded.  Lab Results:  Recent Labs  05/02/17 0106 05/03/17 0658  WBC 4.4 4.2  HGB 12.7 12.3  HCT 36.5 35.8*  PLT 234 250   BMET  Recent Labs  05/02/17 0106 05/03/17 0658  NA 134* 137  K 3.6 3.6  CL 100* 103  CO2 26 24  GLUCOSE 196* 165*  BUN 9 7  CREATININE 0.70 0.79  CALCIUM 8.9 8.5*   LFT  Recent Labs  05/03/17 0658  PROT 7.3  ALBUMIN 3.7  AST 140*  ALT 298*  ALKPHOS 197*  BILITOT 2.6*   PT/INR No results for input(s): LABPROT, INR in the last 72 hours.  Studies/Results: Dg Ercp Biliary & Pancreatic Ducts  Result Date: 05/02/2017 CLINICAL DATA:  46 year old female with a history of ERCP and sphincterotomy EXAM: ERCP TECHNIQUE: Multiple spot images obtained with the fluoroscopic device and submitted for interpretation post-procedure. FLUOROSCOPY TIME:  Fluoroscopy Time:  3 minutes 8 seconds COMPARISON:  None FINDINGS: Limited intraoperative fluoroscopic spot images during ERCP. Initial image demonstrates endoscope projecting over the upper  abdomen. Subsequently there is cannulation of the ampulla and a guidewire in place. Retrograde infusion of contrast partially opacifies the extrahepatic biliary ducts and the gallbladder. Balloon trawl present on the late images, with contrast in the duodenum. IMPRESSION: Limited images during ERCP demonstrating evidence of attempted stone extraction. Please refer to the dictated operative report for full details of intraoperative findings and procedure. Electronically Signed   By: Gilmer MorJaime  Wagner D.O.   On: 05/02/2017 14:55       Assessment / Plan:   46 y/o female who presented with choledocholithiasis, now s/p ERCP with multiple CBD stones removed. Her LAEs are downtrending post ERCP, other labs look good and she feels well. She is scheduled for cholecystectomy today.   Please contact us with any additional questions, we will sign off at this time.   Ileene PatrickSteven Armbruster, MD [facility] Pager [phone]

## 2017-05-03 NOTE — Anesthesia Postprocedure Evaluation (Signed)
Anesthesia Post Note  Patient: Natalie Benjamin  Procedure(s) Performed: Procedure(s) (LRB): LAPAROSCOPIC CHOLECYSTECTOMY (N/A)  Patient location during evaluation: PACU Anesthesia Type: General Level of consciousness: awake and alert Pain management: pain level controlled Vital Signs Assessment: post-procedure vital signs reviewed and stable Respiratory status: spontaneous breathing, nonlabored ventilation, respiratory function stable and patient connected to nasal cannula oxygen Cardiovascular status: blood pressure returned to baseline and stable Postop Assessment: no signs of nausea or vomiting Anesthetic complications: no       Last Vitals:  Vitals:   05/03/17 1414 05/03/17 1541  BP: (!) 148/91 (!) 165/87  Pulse: 87 89  Resp: 18 20  Temp: 36.8 C 36.7 C    Last Pain:  Vitals:   05/03/17 1541  TempSrc: Oral  PainSc:                  Aqib Lough P Alvina Strother

## 2017-05-03 NOTE — Anesthesia Procedure Notes (Signed)
Procedure Name: Intubation Date/Time: 05/03/2017 10:20 AM Performed by: Leroy LibmanEARDON, Paco Cislo L Patient Re-evaluated:Patient Re-evaluated prior to inductionOxygen Delivery Method: Circle system utilized Preoxygenation: Pre-oxygenation with 100% oxygen Intubation Type: IV induction Ventilation: Mask ventilation without difficulty and Oral airway inserted - appropriate to patient size Laryngoscope Size: Hyacinth MeekerMiller and 2 Grade View: Grade I Tube type: Oral Tube size: 7.5 mm Number of attempts: 1 Airway Equipment and Method: Stylet Placement Confirmation: ETT inserted through vocal cords under direct vision,  positive ETCO2 and breath sounds checked- equal and bilateral Secured at: 22 cm Tube secured with: Tape Dental Injury: Teeth and Oropharynx as per pre-operative assessment

## 2017-05-03 NOTE — Transfer of Care (Signed)
Immediate Anesthesia Transfer of Care Note  Patient: Natalie Benjamin  Procedure(s) Performed: Procedure(s): LAPAROSCOPIC CHOLECYSTECTOMY (N/A)  Patient Location: PACU  Anesthesia Type:General  Level of Consciousness: awake and alert   Airway & Oxygen Therapy: Patient Spontanous Breathing and Patient connected to face mask oxygen  Post-op Assessment: Report given to RN and Post -op Vital signs reviewed and stable  Post vital signs: Reviewed and stable  Last Vitals:  Vitals:   05/02/17 2044 05/03/17 0535  BP: 131/85 125/81  Pulse: 91 91  Resp: 16 18  Temp: 37.1 C 37.1 C    Last Pain:  Vitals:   05/03/17 0745  TempSrc:   PainSc: 0-No pain      Patients Stated Pain Goal: 0 (05/02/17 1017)  Complications: No apparent anesthesia complications

## 2017-05-03 NOTE — Anesthesia Preprocedure Evaluation (Addendum)
Anesthesia Evaluation  Patient identified by MRN, date of birth, ID band Patient awake    Reviewed: Allergy & Precautions, NPO status , Patient's Chart, lab work & pertinent test results  Airway Mallampati: II  TM Distance: >3 FB Neck ROM: Full    Dental no notable dental hx.    Pulmonary neg pulmonary ROS,    Pulmonary exam normal breath sounds clear to auscultation       Cardiovascular Normal cardiovascular exam Rhythm:Regular Rate:Normal     Neuro/Psych negative neurological ROS  negative psych ROS   GI/Hepatic Neg liver ROS,   Endo/Other  diabetes, Insulin Dependent, Oral Hypoglycemic AgentsMorbid obesitySuper obese, BMI 46  Renal/GU negative Renal ROS  negative genitourinary   Musculoskeletal negative musculoskeletal ROS (+)   Abdominal   Peds negative pediatric ROS (+)  Hematology negative hematology ROS (+)   Anesthesia Other Findings   Reproductive/Obstetrics negative OB ROS                           Anesthesia Physical  Anesthesia Plan  ASA: IV  Anesthesia Plan: General   Post-op Pain Management:    Induction: Intravenous  Airway Management Planned: Oral ETT  Additional Equipment:   Intra-op Plan:   Post-operative Plan: Extubation in OR  Informed Consent: I have reviewed the patients History and Physical, chart, labs and discussed the procedure including the risks, benefits and alternatives for the proposed anesthesia with the patient or authorized representative who has indicated his/her understanding and acceptance.   Dental advisory given  Plan Discussed with: CRNA and Surgeon  Anesthesia Plan Comments:        Anesthesia Quick Evaluation

## 2017-05-03 NOTE — Discharge Instructions (Signed)

## 2017-05-03 NOTE — Progress Notes (Signed)
1 Day Post-Op   Subjective/Chief Complaint: Feels well this morning. Multiple stones extracted during ERCP yesterday.    Objective: Vital signs in last 24 hours: Temp:  [97.9 F (36.6 C)-98.7 F (37.1 C)] 98.7 F (37.1 C) (05/25 0535) Pulse Rate:  [80-93] 91 (05/25 0535) Resp:  [9-18] 18 (05/25 0535) BP: (125-159)/(79-92) 125/81 (05/25 0535) SpO2:  [95 %-100 %] 100 % (05/25 0535) Last BM Date: 05/02/17  Intake/Output from previous day: 05/24 0701 - 05/25 0700 In: 1950 [P.O.:500; I.V.:1450] Out: -  Intake/Output this shift: No intake/output data recorded.  General appearance: alert and cooperative Chest wall: no tenderness, unlabored respirations GI: soft, non-tender; bowel sounds normal; no masses,  no organomegaly Neurologic: Grossly normal  Lab Results:   Recent Labs  05/02/17 0106 05/03/17 0658  WBC 4.4 4.2  HGB 12.7 12.3  HCT 36.5 35.8*  PLT 234 250   BMET  Recent Labs  05/02/17 0106 05/03/17 0658  NA 134* 137  K 3.6 3.6  CL 100* 103  CO2 26 24  GLUCOSE 196* 165*  BUN 9 7  CREATININE 0.70 0.79  CALCIUM 8.9 8.5*   PT/INR No results for input(s): LABPROT, INR in the last 72 hours. ABG No results for input(s): PHART, HCO3 in the last 72 hours.  Invalid input(s): PCO2, PO2  Studies/Results: Dg Ercp Biliary & Pancreatic Ducts  Result Date: 05/02/2017 CLINICAL DATA:  46 year old female with a history of ERCP and sphincterotomy EXAM: ERCP TECHNIQUE: Multiple spot images obtained with the fluoroscopic device and submitted for interpretation post-procedure. FLUOROSCOPY TIME:  Fluoroscopy Time:  3 minutes 8 seconds COMPARISON:  None FINDINGS: Limited intraoperative fluoroscopic spot images during ERCP. Initial image demonstrates endoscope projecting over the upper abdomen. Subsequently there is cannulation of the ampulla and a guidewire in place. Retrograde infusion of contrast partially opacifies the extrahepatic biliary ducts and the gallbladder. Balloon  trawl present on the late images, with contrast in the duodenum. IMPRESSION: Limited images during ERCP demonstrating evidence of attempted stone extraction. Please refer to the dictated operative report for full details of intraoperative findings and procedure. Electronically Signed   By: Gilmer MorJaime  Wagner D.O.   On: 05/02/2017 14:55    Anti-infectives: Anti-infectives    Start     Dose/Rate Route Frequency Ordered Stop   05/03/17 0600  cefTRIAXone (ROCEPHIN) 2 g in dextrose 5 % 50 mL IVPB  Status:  Discontinued     2 g 100 mL/hr over 30 Minutes Intravenous On call to O.R. 05/02/17 1210 05/02/17 1515   05/02/17 1300  ampicillin-sulbactam (UNASYN) 1.5 g in sodium chloride 0.9 % 50 mL IVPB     1.5 g 100 mL/hr over 30 Minutes Intravenous  Once 05/02/17 1247 05/02/17 1358   05/02/17 1245  cefTRIAXone (ROCEPHIN) 2 g in dextrose 5 % 50 mL IVPB  Status:  Discontinued     2 g 100 mL/hr over 30 Minutes Intravenous On call to O.R. 05/02/17 0901 05/02/17 1210      Assessment/Plan: s/p Procedure(s): ENDOSCOPIC RETROGRADE CHOLANGIOPANCREATOGRAPHY (ERCP) (N/A) OR today for laparoscopic cholecystectomy.   LOS: 2 days    Berna BueChelsea A Connor 05/03/2017

## 2017-05-03 NOTE — Progress Notes (Signed)
Patient ID: Debbie Pacheco, female   DOB: November 27, 1971, 46 y.o.   MRN: [id number]    PROGRESS NOTE    Debbie Pacheco  HYQ:[id number] DOB: [birth year] DOA: 05/01/2017  PCP: System, Pcp Not In   Brief Narrative:  Patient is 46 year old female, presented to [facility] with main concern of 3 days duration of progressively worsening epigastric pain, mostly intermittent and postprandial, associated with nausea and occasional nonbloody vomiting. Please note that patient was transferred from [facility] hospital after patient's blood work showed elevated bilirubin 4.5, AST 522, ALT 570, alkaline phosphatase 213, ultrasound notable for cholelithiasis and CBD 9 mm in diameter. Plan was to transfer patient due to need for MRCP or ERCP.  Assessment & Plan:   Principal Problem:   Choledocholithiasis, symptomatic cholelithiasis, elevated liver enzymes - multiple stones removed with ERCP - pt to go to OR today for lap chole - repeat CBC and CMP in AM  Active Problems:   Diabetes mellitus type 2 in obese (HCC) - on SSI for now until able to take PO    Benign essential HTN - reasonable control     Obesity, Class III, BMI 40-49.9 (morbid obesity) (HCC) - Body mass index is 46.86 kg/m.   DVT prophylaxis: SCD's Code Status: Full Family Communication: Patient at bedside, multiple family members in the room Disposition Plan: Once cleared by surgery team   Consultants:   Surgery  GI  Procedures:   ERCP 05/02/2017  Antimicrobials:   Rocephin 5/23 -->  Subjective: Pt reports she is ready for surgery.   Objective: Vitals:   05/02/17 1500 05/02/17 1700 05/02/17 2044 05/03/17 0535  BP: (!) 153/87 (!) 156/79 131/85 125/81  Pulse: 80 93 91 91  Resp: 18 16 16 18   Temp:  98.5 F (36.9 C) 98.7 F (37.1 C) 98.7 F (37.1 C)  TempSrc:  Oral Oral Oral  SpO2: 96% 100% 99% 100%  Weight:      Height:        Intake/Output Summary (Last 24 hours) at 05/03/17 1113 Last data filed at 05/03/17  1112  Gross per 24 hour  Intake             2950 ml  Output              100 ml  Net             2850 ml   Filed Weights   05/01/17 2325  Weight: 123.8 kg (273 lb)    Examination:  Physical Exam  Constitutional: Appears well-developed and well-nourished. No distress.  CVS: RRR, S1/S2 +, no murmurs, no gallops, no carotid bruit.  Pulmonary: Effort and breath sounds normal, no stridor, rhonchi, wheezes, rales.  Abdominal: Soft. BS +,  no distension, tender in upper abd quadrants    Data Reviewed: I have personally reviewed following labs and imaging studies  CBC:  Recent Labs Lab 05/02/17 0106 05/03/17 0658  WBC 4.4 4.2  NEUTROABS 2.7  --   HGB 12.7 12.3  HCT 36.5 35.8*  MCV 79.3 80.1  PLT 234 250   Basic Metabolic Panel:  Recent Labs Lab 05/02/17 0106 05/03/17 0658  NA 134* 137  K 3.6 3.6  CL 100* 103  CO2 26 24  GLUCOSE 196* 165*  BUN 9 7  CREATININE 0.70 0.79  CALCIUM 8.9 8.5*   Liver Function Tests:  Recent Labs Lab 05/02/17 0106 05/03/17 0658  AST 324* 140*  ALT 433* 298*  ALKPHOS 196* 197*  BILITOT 6.2* 2.6*  PROT 7.8 7.3  ALBUMIN 3.7 3.7    Recent Labs Lab 05/02/17 0106 05/03/17 0658  LIPASE 32 39   CBG:  Recent Labs Lab 05/02/17 0729 05/02/17 1135 05/02/17 1648 05/02/17 2301 05/03/17 0550  GLUCAP 183* 149* 140* 225* 161*   Radiology Studies: Dg Ercp Biliary & Pancreatic Ducts  Result Date: 05/02/2017 CLINICAL DATA:  46 year old female with a history of ERCP and sphincterotomy EXAM: ERCP TECHNIQUE: Multiple spot images obtained with the fluoroscopic device and submitted for interpretation post-procedure. FLUOROSCOPY TIME:  Fluoroscopy Time:  3 minutes 8 seconds COMPARISON:  None FINDINGS: Limited intraoperative fluoroscopic spot images during ERCP. Initial image demonstrates endoscope projecting over the upper abdomen. Subsequently there is cannulation of the ampulla and a guidewire in place. Retrograde infusion of contrast  partially opacifies the extrahepatic biliary ducts and the gallbladder. Balloon trawl present on the late images, with contrast in the duodenum. IMPRESSION: Limited images during ERCP demonstrating evidence of attempted stone extraction. Please refer to the dictated operative report for full details of intraoperative findings and procedure. Electronically Signed   By: Gilmer MorJaime  Wagner D.O.   On: 05/02/2017 14:55    Scheduled Meds: . [MAR Hold] indomethacin  50 mg Rectal Once  . [MAR Hold] insulin aspart  0-9 Units Subcutaneous Q6H  . [MAR Hold] pantoprazole (PROTONIX) IV  40 mg Intravenous QHS   Continuous Infusions: . sodium chloride 100 mL/hr at 05/02/17 2328  . lactated ringers 10 mL/hr at 05/03/17 0941     LOS: 2 days   Time spent: 20 minutes   [name], MD [facility] Pager [phone]  If 7PM-7AM, please contact night-coverage [email or web address] Password [email or web address] 05/03/2017, 11:13 AM

## 2017-05-04 LAB — COMPREHENSIVE METABOLIC PANEL
ALT: 238 U/L — ABNORMAL HIGH (ref 14–54)
AST: 83 U/L — ABNORMAL HIGH (ref 15–41)
Albumin: 3.5 g/dL (ref 3.5–5.0)
Alkaline Phosphatase: 167 U/L — ABNORMAL HIGH (ref 38–126)
Anion gap: 9 (ref 5–15)
BUN: 10 mg/dL (ref 6–20)
CO2: 24 mmol/L (ref 22–32)
Calcium: 8.5 mg/dL — ABNORMAL LOW (ref 8.9–10.3)
Chloride: 102 mmol/L (ref 101–111)
Creatinine, Ser: 0.84 mg/dL (ref 0.44–1.00)
GFR calc Af Amer: >60 mL/min (ref >60)
GFR calc non Af Amer: >60 mL/min (ref >60)
Glucose, Bld: 265 mg/dL — ABNORMAL HIGH (ref 65–99)
Potassium: 3.6 mmol/L (ref 3.5–5.1)
Sodium: 135 mmol/L (ref 135–145)
Total Bilirubin: 1.4 mg/dL — ABNORMAL HIGH (ref 0.3–1.2)
Total Protein: 7.3 g/dL (ref 6.5–8.1)

## 2017-05-04 LAB — CBC
HCT: 36.3 % (ref 36.0–46.0)
Hemoglobin: 12.6 g/dL (ref 12.0–15.0)
MCH: 27.7 pg (ref 26.0–34.0)
MCHC: 34.7 g/dL (ref 30.0–36.0)
MCV: 79.8 fL (ref 78.0–100.0)
Platelets: 265 10*3/uL (ref 150–400)
RBC: 4.55 MIL/uL (ref 3.87–5.11)
RDW: 14.2 % (ref 11.5–15.5)
WBC: 7.6 10*3/uL (ref 4.0–10.5)

## 2017-05-04 LAB — GLUCOSE, CAPILLARY
Glucose-Capillary: 256 mg/dL — ABNORMAL HIGH (ref 65–99)
Glucose-Capillary: 356 mg/dL — ABNORMAL HIGH (ref 65–99)

## 2017-05-04 MED ORDER — HYDROCODONE-ACETAMINOPHEN 7.5-325 MG PO TABS: 1.0000 | ORAL_TABLET | Freq: Four times a day (QID) | ORAL | 0 refills | Status: AC | PRN

## 2017-05-04 MED ORDER — PANTOPRAZOLE SODIUM 40 MG PO TBEC: 40.0000 mg | DELAYED_RELEASE_TABLET | Freq: Every day | ORAL | Status: DC

## 2017-05-04 NOTE — Progress Notes (Signed)
1 Day Post-Op   Subjective/Chief Complaint: Doing fine, pain controlled, no n/v, voiding eating regular food well   Objective: Vital signs in last 24 hours: Temp:  [97.9 F (36.6 C)-98.5 F (36.9 C)] 98.4 F (36.9 C) (05/26 0616) Pulse Rate:  [82-92] 86 (05/26 0616) Resp:  [14-20] 18 (05/26 0616) BP: (143-176)/(83-95) 156/89 (05/26 0616) SpO2:  [92 %-100 %] 97 % (05/26 0616) Last BM Date: 05/02/17  Intake/Output from previous day: 05/25 0701 - 05/26 0700 In: 2080 [P.O.:630; I.V.:1400; IV Piggyback:50] Out: 100 [Blood:100] Intake/Output this shift: No intake/output data recorded.  GI: soft approp tender incisions clean  Lab Results:   Recent Labs  05/02/17 0106 05/03/17 0658  WBC 4.4 4.2  HGB 12.7 12.3  HCT 36.5 35.8*  PLT 234 250   BMET  Recent Labs  05/02/17 0106 05/03/17 0658  NA 134* 137  K 3.6 3.6  CL 100* 103  CO2 26 24  GLUCOSE 196* 165*  BUN 9 7  CREATININE 0.70 0.79  CALCIUM 8.9 8.5*   PT/INR No results for input(s): LABPROT, INR in the last 72 hours. ABG No results for input(s): PHART, HCO3 in the last 72 hours.  Invalid input(s): PCO2, PO2  Studies/Results: Dg Ercp Biliary & Pancreatic Ducts  Result Date: 05/02/2017 CLINICAL DATA:  46 year old female with a history of ERCP and sphincterotomy EXAM: ERCP TECHNIQUE: Multiple spot images obtained with the fluoroscopic device and submitted for interpretation post-procedure. FLUOROSCOPY TIME:  Fluoroscopy Time:  3 minutes 8 seconds COMPARISON:  None FINDINGS: Limited intraoperative fluoroscopic spot images during ERCP. Initial image demonstrates endoscope projecting over the upper abdomen. Subsequently there is cannulation of the ampulla and a guidewire in place. Retrograde infusion of contrast partially opacifies the extrahepatic biliary ducts and the gallbladder. Balloon trawl present on the late images, with contrast in the duodenum. IMPRESSION: Limited images during ERCP demonstrating evidence of  attempted stone extraction. Please refer to the dictated operative report for full details of intraoperative findings and procedure. Electronically Signed   By: Gilmer MorJaime  Wagner D.O.   On: 05/02/2017 14:55    Anti-infectives: Anti-infectives    Start     Dose/Rate Route Frequency Ordered Stop   05/03/17 0930  cefTRIAXone (ROCEPHIN) 2 g in dextrose 5 % 50 mL IVPB     2 g 100 mL/hr over 30 Minutes Intravenous On call to O.R. 05/03/17 0927 05/03/17 1037   05/03/17 0930  dextrose 5 % with cefTRIAXone (ROCEPHIN) ADS Med    Comments:  Leroy Libmaneardon, Diana   : cabinet override      05/03/17 0930 05/03/17 1022   05/03/17 0600  cefTRIAXone (ROCEPHIN) 2 g in dextrose 5 % 50 mL IVPB  Status:  Discontinued     2 g 100 mL/hr over 30 Minutes Intravenous On call to O.R. 05/02/17 1210 05/02/17 1515   05/02/17 1300  ampicillin-sulbactam (UNASYN) 1.5 g in sodium chloride 0.9 % 50 mL IVPB     1.5 g 100 mL/hr over 30 Minutes Intravenous  Once 05/02/17 1247 05/02/17 1358   05/02/17 1245  cefTRIAXone (ROCEPHIN) 2 g in dextrose 5 % 50 mL IVPB  Status:  Discontinued     2 g 100 mL/hr over 30 Minutes Intravenous On call to O.R. 05/02/17 0901 05/02/17 1210      Assessment/Plan: POD 1 lap chole  Dc home today I left rx for norco NCCSR reviewed today  [facility] CenterWAKEFIELD,Rayden Scheper 05/04/2017

## 2017-05-04 NOTE — Progress Notes (Signed)
PT IV removed. DC instructions reviewed. Paper prescription provided. Patient understands follow up instructions and medication instructions. Escorted to ride by NT.

## 2017-05-04 NOTE — Progress Notes (Signed)
The patient is receiving Protonix by the intravenous route.  Based on criteria approved by the Pharmacy and Therapeutics Committee and the Medical Executive Committee, the medication is being converted to the equivalent oral dose form.  These criteria include: -No active GI bleeding -Able to tolerate diet of full liquids (or better) or tube feeding -Able to tolerate other medications by the oral or enteral route  If you have any questions about this conversion, please contact the Pharmacy Department (phone [phone]).  Thank you.  Otho BellowsGreen, Nazyia Gaugh L PharmD Pager [phone] 05/04/2017, 8:49 AM

## 2017-05-04 NOTE — Discharge Summary (Signed)
Physician Discharge Summary  Debbie Pacheco NWG:[id number] DOB: 1971-03-12 DOA: 05/01/2017  PCP: System, Pcp Not In  Admit date: 05/01/2017 Discharge date: 05/04/2017  Recommendations for Outpatient Follow-up:  1. Pt will need to follow up with PCP in 2-3 weeks post discharge 2. Please obtain CMP to evaluate electrolytes and kidney function 3. Please also check CBC to evaluate Hg and Hct levels  Discharge Diagnoses:  Principal Problem:   Choledocholithiasis Active Problems:   Elevated liver enzymes   Diabetes mellitus type 2 in obese (HCC)   Benign essential HTN   Obesity, Class III, BMI 40-49.9 (morbid obesity) (HCC)   Abdominal pain, epigastric  Discharge Condition: Stable  Diet recommendation: Heart healthy diet discussed in details   Brief Narrative:  Patient is 46 year old female, presented to [facility] with main concern of 3 days duration of progressively worsening epigastric pain, mostly intermittent and postprandial, associated with nausea and occasional nonbloody vomiting. Please note that patient was transferred from [facility] hospital after patient's blood work showed elevated bilirubin 4.5, AST 522, ALT 570, alkaline phosphatase 213, ultrasound notable for cholelithiasis and CBD 9 mm in diameter. Plan was to transfer patient due to need for MRCP or ERCP.  Assessment & Plan:   Principal Problem:   Choledocholithiasis, symptomatic cholelithiasis, elevated liver enzymes - multiple stones removed with ERCP - pt is now s/p lac chole post op day #1 - pt clinically stable, doing well, tolerating diet well - wants to go home, surgery team cleared for discharge  - no need for ABX on discharge   Active Problems:   Diabetes mellitus type 2 in obese (HCC) - resume home medical regimen     Benign essential HTN - pt advised to follow up with PCP to discuss need for antihypertensive regimen - I suspect when pain subsides, BP hopefully stabilizes     Obesity, Class III,  BMI 40-49.9 (morbid obesity) (HCC) - Body mass index is 46.86 kg/m.   DVT prophylaxis: SCD's Code Status: Full Family Communication: patient at bedside  Disposition Plan: home  Consultants:   Surgery  GI  Procedures:   ERCP 05/02/2017  Lap chole 05/03/17  Antimicrobials:   Rocephin 5/23 -->  Procedures/Studies: Dg Ercp Biliary & Pancreatic Ducts  Result Date: 05/02/2017 CLINICAL DATA:  46 year old female with a history of ERCP and sphincterotomy EXAM: ERCP TECHNIQUE: Multiple spot images obtained with the fluoroscopic device and submitted for interpretation post-procedure. FLUOROSCOPY TIME:  Fluoroscopy Time:  3 minutes 8 seconds COMPARISON:  None FINDINGS: Limited intraoperative fluoroscopic spot images during ERCP. Initial image demonstrates endoscope projecting over the upper abdomen. Subsequently there is cannulation of the ampulla and a guidewire in place. Retrograde infusion of contrast partially opacifies the extrahepatic biliary ducts and the gallbladder. Balloon trawl present on the late images, with contrast in the duodenum. IMPRESSION: Limited images during ERCP demonstrating evidence of attempted stone extraction. Please refer to the dictated operative report for full details of intraoperative findings and procedure. Electronically Signed   By: Debbie Mor D.O.   On: 05/02/2017 14:55     Discharge Exam: Vitals:   05/03/17 2059 05/04/17 0616  BP: (!) 176/93 (!) 156/89  Pulse: 92 86  Resp: 19 18  Temp: 98.2 F (36.8 C) 98.4 F (36.9 C)   Vitals:   05/03/17 1541 05/03/17 1628 05/03/17 2059 05/04/17 0616  BP: (!) 165/87 (!) 143/95 (!) 176/93 (!) 156/89  Pulse: 89 92 92 86  Resp: 20 20 19 18   Temp: 98.1 F (36.7 C)  98.5 F (36.9 C) 98.2 F (36.8 C) 98.4 F (36.9 C)  TempSrc: Oral Oral Oral Oral  SpO2: 97% 98% 100% 97%  Weight:      Height:        General: Pt is alert, follows commands appropriately, not in acute distress Cardiovascular: Regular  rate and rhythm, S1/S2 +, no murmurs, no rubs, no gallops Respiratory: Clear to auscultation bilaterally, no wheezing, no crackles, no rhonchi Abdominal: Soft, non tender, non distended, bowel sounds +, no guarding Extremities: no edema, no cyanosis, pulses palpable bilaterally DP and PT Neuro: Grossly nonfocal  Discharge Instructions  Discharge Instructions    Diet - low sodium heart healthy    Complete by:  As directed    Increase activity slowly    Complete by:  As directed      Allergies as of 05/04/2017      Reactions   Ciprofloxacin In D5w Nausea And Vomiting   Ibuprofen Other (See Comments)   Stomach cramps   Percocet [oxycodone-acetaminophen] Other (See Comments)   Stomach cramps      Medication List    TAKE these medications   glipiZIDE 5 MG tablet Commonly known as:  GLUCOTROL Take 5 mg by mouth 2 (two) times daily before a meal.   HYDROcodone-acetaminophen 7.5-325 MG tablet Commonly known as:  NORCO Take 1-2 tablets by mouth every 6 (six) hours as needed for moderate pain or severe pain.   insulin NPH Human 100 UNIT/ML injection Commonly known as:  HUMULIN N,NOVOLIN N Inject 40 Units into the skin 2 (two) times daily.   metFORMIN 1000 MG tablet Commonly known as:  GLUCOPHAGE Take 1,000 mg by mouth 2 (two) times daily with a meal.      Follow-up Information    Debbie Bueonnor, Chelsea A, MD. Go on 05/22/2017.   Specialty:  General Surgery Why:  Your appointment is 05/22/17 at 4:15PM. Please arrive 30 minutes prior to your appointment to check in and fill out necessary paperwork. Contact information: [phone]        Debbie OgleMyers, [name], MD. Call.   Specialty:  Internal Medicine Why:  cal with questions please  Contact information: [address] [place] [address] [phone]            The results of significant diagnostics from this hospitalization (including imaging, microbiology, ancillary and laboratory) are listed below for  reference.     Microbiology: Recent Results (from the past 240 hour(s))  Surgical pcr screen     Status: None   Collection Time: 05/02/17 11:27 AM  Result Value Ref Range Status   MRSA, PCR NEGATIVE NEGATIVE Final   Staphylococcus aureus NEGATIVE NEGATIVE Final    Comment:        The Xpert SA Assay (FDA approved for NASAL specimens in patients over 45 years of age), is one component of a comprehensive surveillance program.  Test performance has been validated by [facility] for patients greater than or equal to 45 year old. It is not intended to diagnose infection nor to guide or monitor treatment.      Labs: Basic Metabolic Panel:  Recent Labs Lab 05/02/17 0106 05/03/17 0658 05/04/17 0755  NA 134* 137 135  K 3.6 3.6 3.6  CL 100* 103 102  CO2 26 24 24   GLUCOSE 196* 165* 265*  BUN 9 7 10   CREATININE 0.70 0.79 0.84  CALCIUM 8.9 8.5* 8.5*   Liver Function Tests:  Recent Labs Lab 05/02/17 0106 05/03/17 0658 05/04/17  0755  AST 324* 140* 83*  ALT 433* 298* 238*  ALKPHOS 196* 197* 167*  BILITOT 6.2* 2.6* 1.4*  PROT 7.8 7.3 7.3  ALBUMIN 3.7 3.7 3.5    Recent Labs Lab 05/02/17 0106 05/03/17 0658  LIPASE 32 39   CBC:  Recent Labs Lab 05/02/17 0106 05/03/17 0658 05/04/17 0755  WBC 4.4 4.2 7.6  NEUTROABS 2.7  --   --   HGB 12.7 12.3 12.6  HCT 36.5 35.8* 36.3  MCV 79.3 80.1 79.8  PLT 234 250 265   CBG:  Recent Labs Lab 05/02/17 2301 05/03/17 0550 05/03/17 1214 05/03/17 2353 05/04/17 0612  GLUCAP 225* 161* 166* 324* 256*   SIGNED: Time coordinating discharge:  30 minutes  Debbora Presto, MD  [facility] 05/04/2017, 10:52 AM Pager [phone]  If 7PM-7AM, please contact night-coverage [email or web address] Password [email or web address]

## 2017-05-07 ENCOUNTER — Encounter (HOSPITAL_COMMUNITY): Payer: Self-pay | Admitting: Surgery

## 2017-05-07 LAB — GLUCOSE, CAPILLARY: Glucose-Capillary: 201 mg/dL — ABNORMAL HIGH (ref 65–99)

## 2019-04-05 IMAGING — RF DG ERCP WO/W SPHINCTEROTOMY
1 series · 15 of 19 positions shown · non-contrast
Comparison: None

CLINICAL DATA: 45-year-old female with a history of ERCP and
sphincterotomy

EXAM:
ERCP
TECHNIQUE: Multiple spot images obtained with the fluoroscopic device and
submitted for interpretation post-procedure.
FLUOROSCOPY TIME:  Fluoroscopy Time:  3 minutes 8 seconds

[Series 1: run · 15 of 19 slices shown]
[im 1/19]
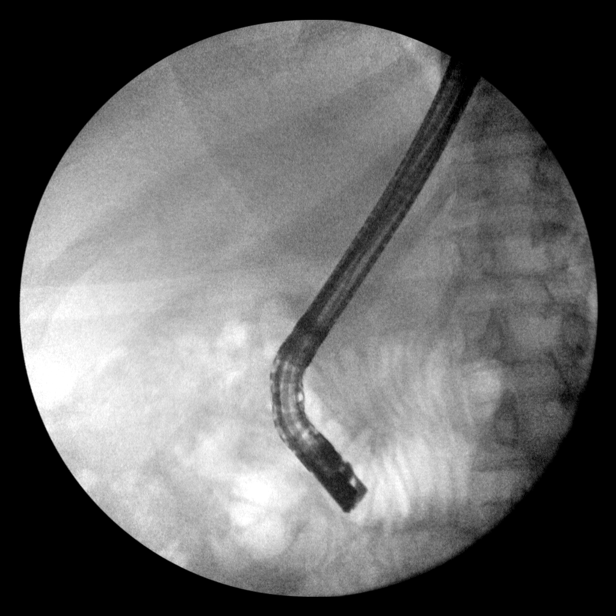
[im 2/19]
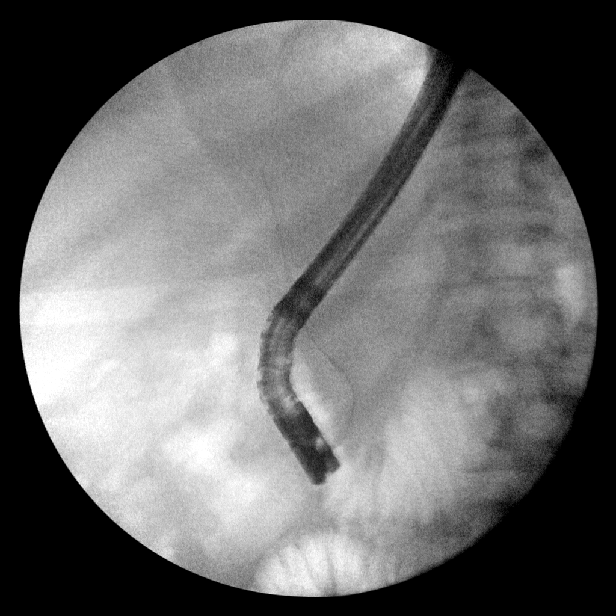
[im 4/19]
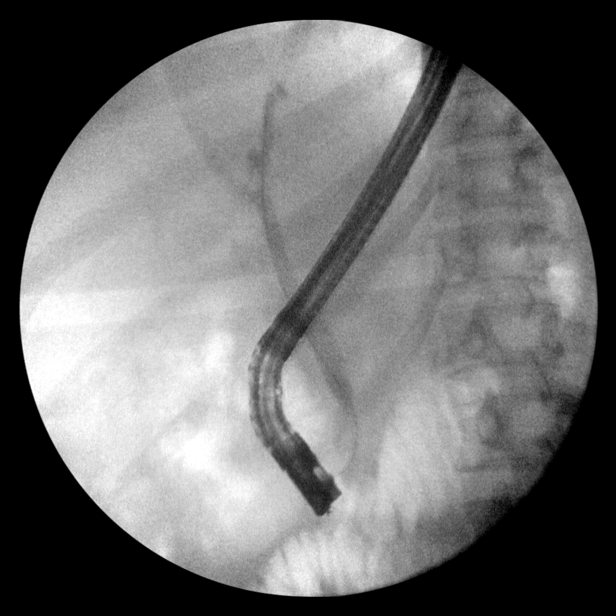
[im 5/19]
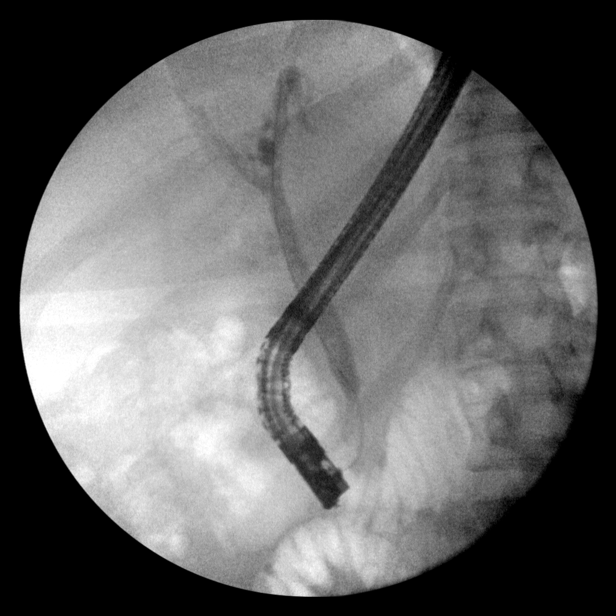
[im 6/19]
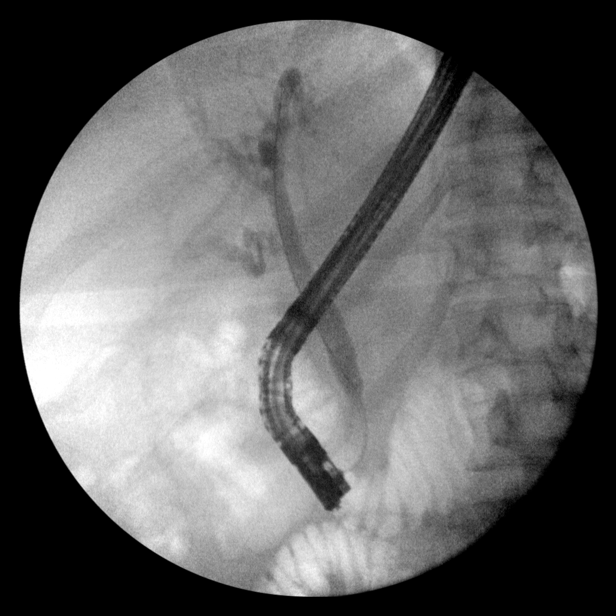
[im 7/19]
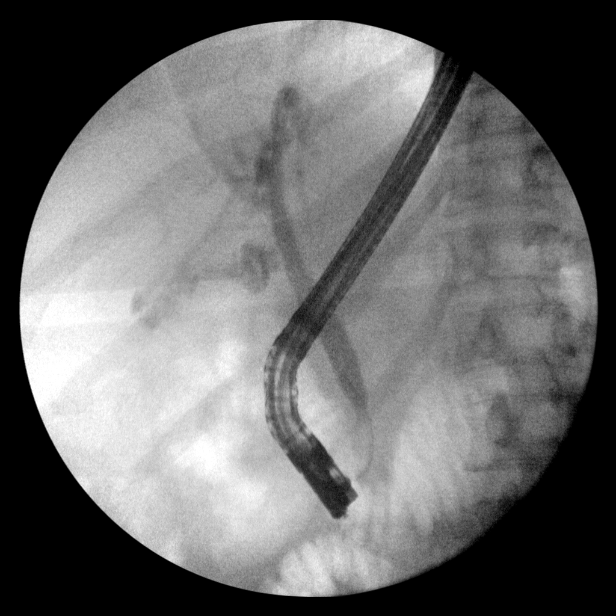
[im 9/19]
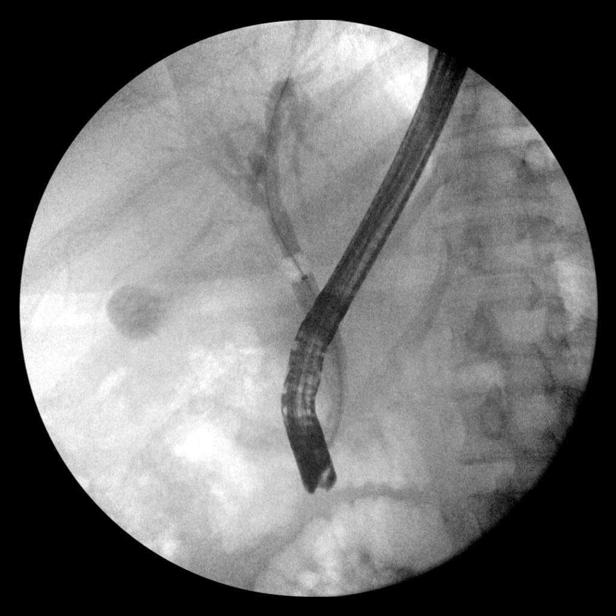
[im 10/19]
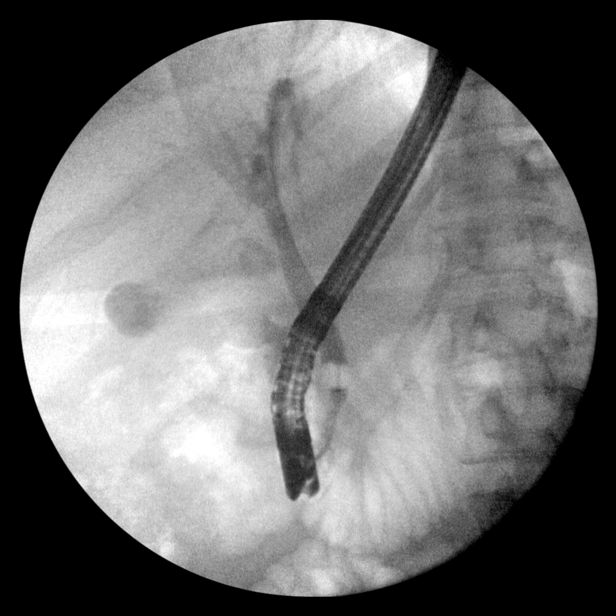
[im 11/19]
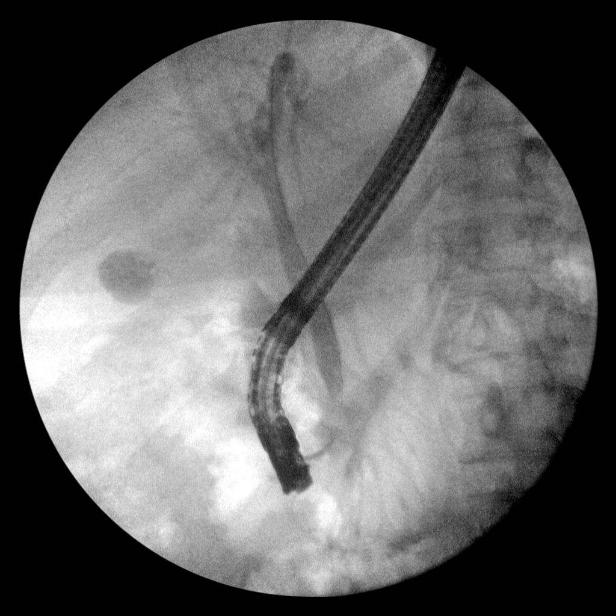
[im 13/19]
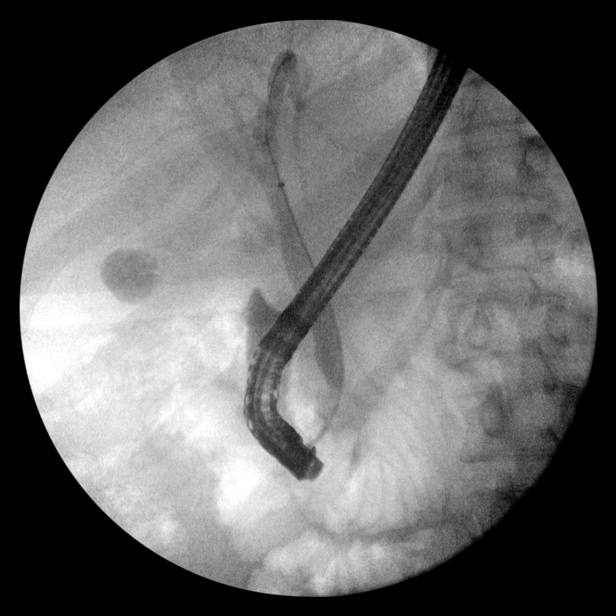
[im 14/19]
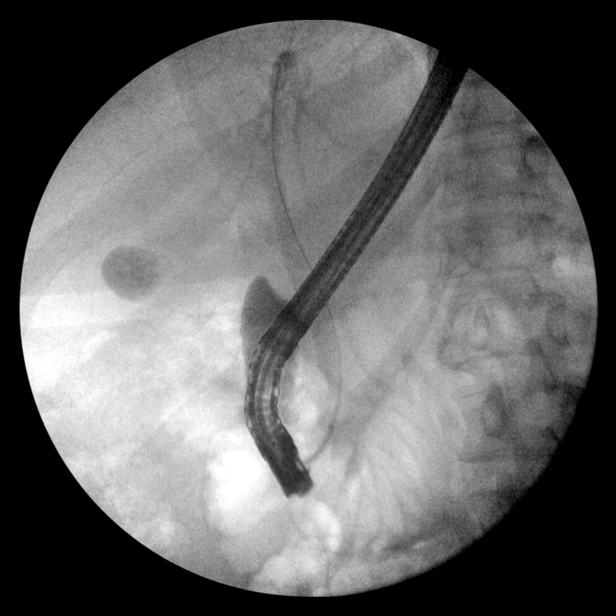
[im 15/19]
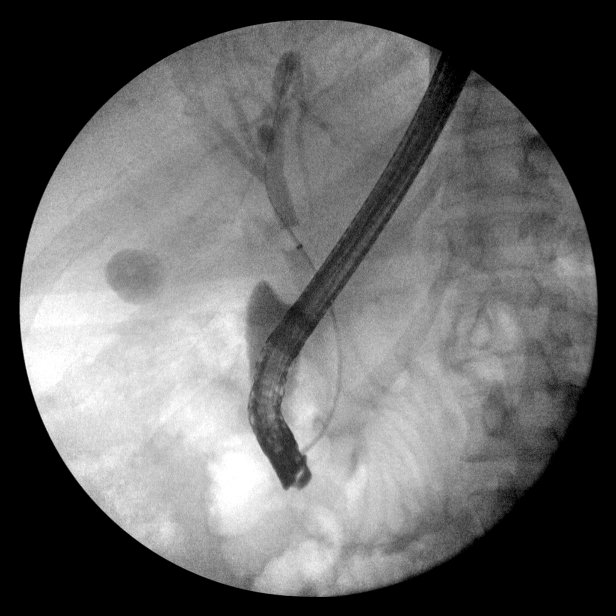
[im 16/19]
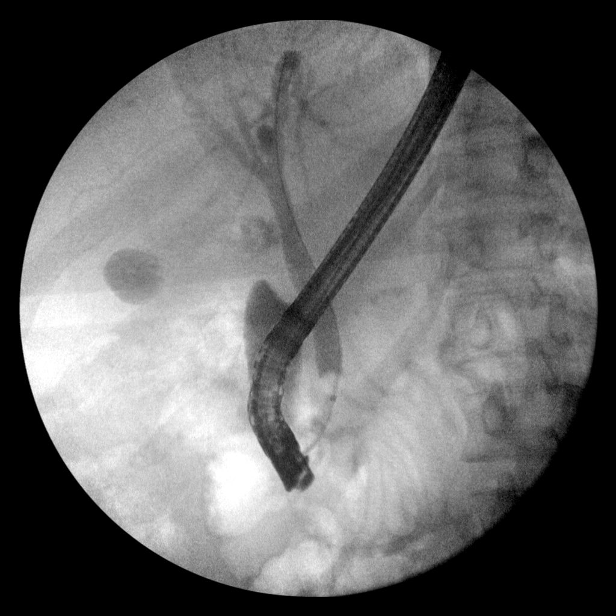
[im 18/19]
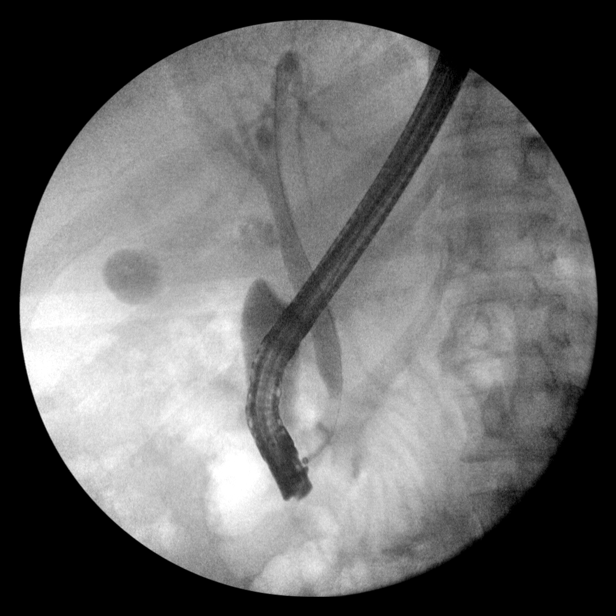
[im 19/19]
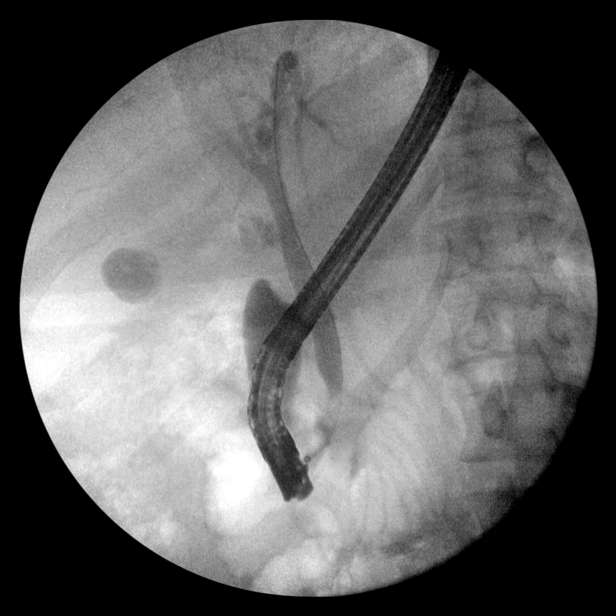

[15 of 19 positions shown; findings below may reference images not displayed]

FINDINGS: Limited intraoperative fluoroscopic spot images during ERCP.

Initial image demonstrates endoscope projecting over the upper
abdomen. Subsequently there is cannulation of the ampulla and a
guidewire in place.

Retrograde infusion of contrast partially opacifies the extrahepatic
biliary ducts and the gallbladder.

Balloon trawl present on the late images, with contrast in the
duodenum.
IMPRESSION: Limited images during ERCP demonstrating evidence of attempted stone
extraction.

Please refer to the dictated operative report for full details of
intraoperative findings and procedure.
